# Patient Record
Sex: Female | Born: 1935 | Race: White | Hispanic: No | Marital: Married | Smoking: Former smoker
Health system: Southern US, Community
[De-identification: ages and names within clinical notes are randomized; demographics above are authoritative.]

## PROBLEM LIST (undated history)

## (undated) DIAGNOSIS — G43909 Migraine, unspecified, not intractable, without status migrainosus: Secondary | ICD-10-CM

## (undated) DIAGNOSIS — G4700 Insomnia, unspecified: Secondary | ICD-10-CM

## (undated) DIAGNOSIS — H43813 Vitreous degeneration, bilateral: Secondary | ICD-10-CM

## (undated) DIAGNOSIS — R1011 Right upper quadrant pain: Secondary | ICD-10-CM

## (undated) DIAGNOSIS — M8580 Other specified disorders of bone density and structure, unspecified site: Secondary | ICD-10-CM

## (undated) DIAGNOSIS — K5790 Diverticulosis of intestine, part unspecified, without perforation or abscess without bleeding: Secondary | ICD-10-CM

## (undated) DIAGNOSIS — M47812 Spondylosis without myelopathy or radiculopathy, cervical region: Secondary | ICD-10-CM

## (undated) DIAGNOSIS — E782 Mixed hyperlipidemia: Secondary | ICD-10-CM

## (undated) HISTORY — PX: EYE SURGERY: SHX253

## (undated) HISTORY — PX: BREAST CYST ASPIRATION: SHX578

---

## 1995-08-12 DIAGNOSIS — C569 Malignant neoplasm of unspecified ovary: Secondary | ICD-10-CM

## 1995-08-12 HISTORY — DX: Malignant neoplasm of unspecified ovary: C56.9

## 1995-08-12 HISTORY — PX: TOTAL ABDOMINAL HYSTERECTOMY W/ BILATERAL SALPINGOOPHORECTOMY: SHX83

## 2006-08-11 HISTORY — PX: COLONOSCOPY: SHX174

## 2016-08-22 HISTORY — PX: UPPER GASTROINTESTINAL ENDOSCOPY: SHX188

## 2016-08-22 HISTORY — PX: ESOPHAGOGASTRODUODENOSCOPY: SHX1529

## 2016-09-12 DIAGNOSIS — H2512 Age-related nuclear cataract, left eye: Secondary | ICD-10-CM

## 2016-09-12 HISTORY — DX: Age-related nuclear cataract, left eye: H25.12

## 2017-01-26 DIAGNOSIS — I471 Supraventricular tachycardia: Secondary | ICD-10-CM

## 2017-01-26 DIAGNOSIS — I4719 Other supraventricular tachycardia: Secondary | ICD-10-CM

## 2017-01-26 HISTORY — DX: Supraventricular tachycardia: I47.1

## 2017-01-26 HISTORY — DX: Other supraventricular tachycardia: I47.19

## 2017-03-10 DIAGNOSIS — R0789 Other chest pain: Secondary | ICD-10-CM

## 2017-03-10 HISTORY — DX: Other chest pain: R07.89

## 2018-04-28 DIAGNOSIS — F411 Generalized anxiety disorder: Secondary | ICD-10-CM

## 2018-04-28 HISTORY — DX: Generalized anxiety disorder: F41.1

## 2018-05-03 ENCOUNTER — Other Ambulatory Visit: Payer: Self-pay | Admitting: Nurse Practitioner

## 2018-05-03 DIAGNOSIS — R5381 Other malaise: Secondary | ICD-10-CM

## 2018-05-12 ENCOUNTER — Other Ambulatory Visit: Payer: Self-pay | Admitting: Nurse Practitioner

## 2018-05-12 DIAGNOSIS — M8580 Other specified disorders of bone density and structure, unspecified site: Secondary | ICD-10-CM

## 2018-07-14 ENCOUNTER — Ambulatory Visit
Admission: RE | Admit: 2018-07-14 | Discharge: 2018-07-14 | Disposition: A | Discharge status: Home / Self Care | Payer: Medicare HMO | Source: Ambulatory Visit | Attending: Nurse Practitioner | Admitting: Nurse Practitioner

## 2018-07-14 DIAGNOSIS — M8580 Other specified disorders of bone density and structure, unspecified site: Secondary | ICD-10-CM

## 2019-06-21 ENCOUNTER — Other Ambulatory Visit: Payer: Self-pay | Admitting: Family Medicine

## 2019-06-21 DIAGNOSIS — Z1231 Encounter for screening mammogram for malignant neoplasm of breast: Secondary | ICD-10-CM

## 2020-03-26 ENCOUNTER — Ambulatory Visit (INDEPENDENT_AMBULATORY_CARE_PROVIDER_SITE_OTHER): Payer: Medicare HMO | Admitting: Otolaryngology

## 2020-03-26 ENCOUNTER — Other Ambulatory Visit: Payer: Self-pay

## 2020-03-26 VITALS — Temp 97.3°F

## 2020-03-26 DIAGNOSIS — H903 Sensorineural hearing loss, bilateral: Secondary | ICD-10-CM

## 2020-03-26 DIAGNOSIS — H6121 Impacted cerumen, right ear: Secondary | ICD-10-CM

## 2020-03-26 DIAGNOSIS — H6982 Other specified disorders of Eustachian tube, left ear: Secondary | ICD-10-CM | POA: Diagnosis not present

## 2020-03-26 NOTE — Progress Notes (Signed)
HPI: Gloria Goodman is a 86 y.o. female who presents for evaluation of of complaints of ear symptoms as well as nasal symptoms of excessive mucus gathering in the throat.  She feels like the left ear stopped up at times and she hears a hollow sound in the left ear and occasionally hears a flutter in the left ear.  She had problems like this previously several years ago and treated  eustachian tube dysfunction with Flonase and saline rinses that seemed to help but not totally clear the symptoms.  She had a hearing test performed at that time but this was several years ago.  She has noticed some decline in her hearing.  No past medical history on file.  Social History   Socioeconomic History  . Marital status: Married    Spouse name: Not on file  . Number of children: Not on file  . Years of education: Not on file  . Highest education level: Not on file  Occupational History  . Not on file  Tobacco Use  . Smoking status: Not on file  Substance and Sexual Activity  . Alcohol use: Not on file  . Drug use: Not on file  . Sexual activity: Not on file  Other Topics Concern  . Not on file  Social History Narrative  . Not on file   Social Determinants of Health   Financial Resource Strain:   . Difficulty of Paying Living Expenses:   Food Insecurity:   . Worried About Charity fundraiser in the Last Year:   . Arboriculturist in the Last Year:   Transportation Needs:   . Film/video editor (Medical):   Marland Kitchen Lack of Transportation (Non-Medical):   Physical Activity:   . Days of Exercise per Week:   . Minutes of Exercise per Session:   Stress:   . Feeling of Stress :   Social Connections:   . Frequency of Communication with Friends and Family:   . Frequency of Social Gatherings with Friends and Family:   . Attends Religious Services:   . Active Member of Clubs or Organizations:   . Attends Archivist Meetings:   Marland Kitchen Marital Status:    No family history on  file. Allergies  Allergen Reactions  . Statins     Pt felt bad all over.   Prior to Admission medications   Not on File     Positive ROS: Otherwise negative  All other systems have been reviewed and were otherwise negative with the exception of those mentioned in the HPI and as above.  Physical Exam: Constitutional: Alert, well-appearing, no acute distress Ears: External ears without lesions or tenderness.  She has wax buildup in the right ear canal that was removed with forceps.  The right TM was clear with good mobility pneumatic otoscopy.  The left ear canal had minimal cerumen buildup that was nonobstructing and removed with a curette.  The left TM was clear with good mobility pneumatic otoscopy.  Hearing screening with the 1024 tuning fork revealed bilateral mild to moderate SNHL which is worse on the left side.  AC > BC bilaterally. Nasal: External nose without lesions. Septum with minimal deformity and mild rhinitis.. Clear nasal passages bilaterally.  Nasal pharynx could be visualized.  Both middle meatus regions were clear with no signs of infection. Oral: Lips and gums without lesions. Tongue and palate mucosa without lesions. Posterior oropharynx clear.  No excessive mucus noted in the posterior oropharynx. Neck: No  palpable adenopathy or masses Respiratory: Breathing comfortably  Skin: No facial/neck lesions or rash noted.  Cerumen impaction removal  Date/Time: 03/26/2020 2:50 PM Performed by: Rozetta Nunnery, MD Authorized by: Rozetta Nunnery, MD   Consent:    Consent obtained:  Verbal   Consent given by:  Patient   Risks discussed:  Pain and bleeding Procedure details:    Location:  R ear   Procedure type: forceps   Post-procedure details:    Inspection:  TM intact and canal normal   Hearing quality:  Improved   Patient tolerance of procedure:  Tolerated well, no immediate complications Comments:     TMs are clear  bilaterally.    Assessment: Mild rhinitis with questionable eustachian tube dysfunction. Bilateral SNHL.  Plan: Recommended use of Flonase and renewed her prescription 2 sprays each nostril at night as well as saline irrigation. She will follow-up in 2 to 3 weeks for recheck and audiologic testing.  Radene Journey, MD

## 2020-04-13 ENCOUNTER — Other Ambulatory Visit: Payer: Self-pay | Admitting: Nurse Practitioner

## 2020-04-13 DIAGNOSIS — Z1231 Encounter for screening mammogram for malignant neoplasm of breast: Secondary | ICD-10-CM

## 2020-04-18 ENCOUNTER — Ambulatory Visit (INDEPENDENT_AMBULATORY_CARE_PROVIDER_SITE_OTHER): Payer: Medicare HMO | Admitting: Otolaryngology

## 2020-04-26 ENCOUNTER — Ambulatory Visit
Admission: RE | Admit: 2020-04-26 | Discharge: 2020-04-26 | Disposition: A | Discharge status: Home / Self Care | Payer: Medicare HMO | Source: Ambulatory Visit | Attending: Nurse Practitioner | Admitting: Nurse Practitioner

## 2020-04-26 ENCOUNTER — Other Ambulatory Visit: Payer: Self-pay

## 2020-04-26 DIAGNOSIS — Z1231 Encounter for screening mammogram for malignant neoplasm of breast: Secondary | ICD-10-CM

## 2020-08-11 HISTORY — PX: CATARACT EXTRACTION: SUR2

## 2020-11-27 ENCOUNTER — Ambulatory Visit: Payer: Medicare HMO | Admitting: Physician Assistant

## 2021-01-18 ENCOUNTER — Other Ambulatory Visit: Payer: Self-pay | Admitting: Neurosurgery

## 2021-01-18 DIAGNOSIS — G959 Disease of spinal cord, unspecified: Secondary | ICD-10-CM

## 2021-01-28 ENCOUNTER — Ambulatory Visit
Admission: RE | Admit: 2021-01-28 | Discharge: 2021-01-28 | Disposition: A | Discharge status: Home / Self Care | Payer: Medicare HMO | Source: Ambulatory Visit | Attending: Neurosurgery | Admitting: Neurosurgery

## 2021-01-28 ENCOUNTER — Other Ambulatory Visit: Payer: Self-pay

## 2021-01-28 DIAGNOSIS — G959 Disease of spinal cord, unspecified: Secondary | ICD-10-CM

## 2021-02-05 ENCOUNTER — Other Ambulatory Visit: Payer: Self-pay | Admitting: Student

## 2021-02-05 DIAGNOSIS — D497 Neoplasm of unspecified behavior of endocrine glands and other parts of nervous system: Secondary | ICD-10-CM

## 2021-02-06 ENCOUNTER — Ambulatory Visit
Admission: RE | Admit: 2021-02-06 | Discharge: 2021-02-06 | Disposition: A | Discharge status: Home / Self Care | Payer: Medicare HMO | Source: Ambulatory Visit | Attending: Student | Admitting: Student

## 2021-02-06 DIAGNOSIS — D497 Neoplasm of unspecified behavior of endocrine glands and other parts of nervous system: Secondary | ICD-10-CM

## 2021-02-06 MED ORDER — GADOBENATE DIMEGLUMINE 529 MG/ML IV SOLN
11.0000 mL | Freq: Once | INTRAVENOUS | Status: AC | PRN
  Administered 2021-02-06: 11 mL via INTRAVENOUS

## 2021-02-20 ENCOUNTER — Other Ambulatory Visit: Payer: Self-pay | Admitting: Neurosurgery

## 2021-03-04 NOTE — Progress Notes (Signed)
Surgical Instructions    Your procedure is scheduled on Friday, July 29th, 2022 .  Report to [facility] Main Entrance "A" at 10:20 A.M., then check in with the Admitting office.  Call this number if you have problems the morning of surgery:  [phone]   If you have any questions prior to your surgery date call [phone]: Open Monday-Friday 8am-4pm    Remember:  Do not eat or drink after midnight the night before your surgery   Take these medicines the morning of surgery with A SIP OF WATER:  ALPRAZolam (XANAX)  acetaminophen (TYLENOL) - if needed Polyethyl Glycol-Propyl Glycol (SYSTANE OP) - if needed sodium chloride (OCEAN) - if needed  Follow your surgeon's instructions on when to stop Aspirin.  If no instructions were given by your surgeon then you will need to call the office to get those instructions.     As of today, STOP taking any Aspirin (unless otherwise instructed by your surgeon) Aleve, Naproxen, Ibuprofen, Motrin, Advil, Goody's, BC's, all herbal medications, fish oil, and all vitamins.          Do not wear jewelry or makeup Do not wear lotions, powders, perfumes, or deodorant. Do not shave 48 hours prior to surgery.   Do not bring valuables to the hospital. DO Not wear nail polish, gel polish, artificial nails, or any other type of covering on natural nails including finger and toenails. If patients have artificial nails, gel coating, etc. that need to be removed by a nail salon please have this removed prior to surgery or surgery may need to be canceled/delayed if the surgeon/ anesthesia feels like the patient is unable to be adequately monitored.             [facility] is not responsible for any belongings or valuables.  Do NOT Smoke (Tobacco/Vaping) or drink Alcohol 24 hours prior to your procedure If you use a CPAP at night, you may bring all equipment for your overnight stay.   Contacts, glasses, dentures or bridgework may not be worn into surgery,  please bring cases for these belongings   For patients admitted to the hospital, discharge time will be determined by your treatment team.   Patients discharged the day of surgery will not be allowed to drive home, and someone needs to stay with them for 24 hours.  ONLY 1 SUPPORT PERSON MAY BE PRESENT WHILE YOU ARE IN SURGERY. IF YOU ARE TO BE ADMITTED ONCE YOU ARE IN YOUR ROOM YOU WILL BE ALLOWED TWO (2) VISITORS.  Minor children may have two parents present. Special consideration for safety and communication needs will be reviewed on a case by case basis.  Special instructions:    Oral Hygiene is also important to reduce your risk of infection.  Remember - BRUSH YOUR TEETH THE MORNING OF SURGERY WITH YOUR REGULAR TOOTHPASTE   [place]- Preparing For Surgery  Before surgery, you can play an important role. Because skin is not sterile, your skin needs to be as free of germs as possible. You can reduce the number of germs on your skin by washing with CHG (chlorahexidine gluconate) Soap before surgery.  CHG is an antiseptic cleaner which kills germs and bonds with the skin to continue killing germs even after washing.     Please do not use if you have an allergy to CHG or antibacterial soaps. If your skin becomes reddened/irritated stop using the CHG.  Do not shave (including legs and underarms) for at least 48  hours prior to first CHG shower. It is OK to shave your face.  Please follow these instructions carefully.     Shower the NIGHT BEFORE SURGERY and the MORNING OF SURGERY with CHG Soap.   If you chose to wash your hair, wash your hair first as usual with your normal shampoo. After you shampoo, rinse your hair and body thoroughly to remove the shampoo.  Then [employer] and genitals (private parts) with your normal soap and rinse thoroughly to remove soap.  After that Use CHG Soap as you would any other liquid soap. You can apply CHG directly to the skin and wash gently with a scrungie  or a clean washcloth.   Apply the CHG Soap to your body ONLY FROM THE NECK DOWN.  Do not use on open wounds or open sores. Avoid contact with your eyes, ears, mouth and genitals (private parts). Wash Face and genitals (private parts)  with your normal soap.   Wash thoroughly, paying special attention to the area where your surgery will be performed.  Thoroughly rinse your body with warm water from the neck down.  DO NOT shower/wash with your normal soap after using and rinsing off the CHG Soap.  Pat yourself dry with a CLEAN TOWEL.  Wear CLEAN PAJAMAS to bed the night before surgery  Place CLEAN SHEETS on your bed the night before your surgery  DO NOT SLEEP WITH PETS.   Day of Surgery:  Take a shower with CHG soap. Wear Clean/Comfortable clothing the morning of surgery Do not apply any deodorants/lotions.   Remember to brush your teeth WITH YOUR REGULAR TOOTHPASTE.   Please read over the following fact sheets that you were given.

## 2021-03-05 ENCOUNTER — Encounter (HOSPITAL_COMMUNITY): Payer: Self-pay

## 2021-03-05 ENCOUNTER — Other Ambulatory Visit: Payer: Self-pay

## 2021-03-05 ENCOUNTER — Encounter (HOSPITAL_COMMUNITY)
Admission: RE | Admit: 2021-03-05 | Discharge: 2021-03-05 | Disposition: A | Discharge status: Home / Self Care | Payer: Medicare HMO | Source: Ambulatory Visit | Attending: Neurosurgery | Admitting: Neurosurgery

## 2021-03-05 DIAGNOSIS — Z01812 Encounter for preprocedural laboratory examination: Secondary | ICD-10-CM | POA: Insufficient documentation

## 2021-03-05 DIAGNOSIS — Z20822 Contact with and (suspected) exposure to covid-19: Secondary | ICD-10-CM | POA: Insufficient documentation

## 2021-03-05 HISTORY — DX: Migraine, unspecified, not intractable, without status migrainosus: G43.909

## 2021-03-05 HISTORY — DX: Mixed hyperlipidemia: E78.2

## 2021-03-05 HISTORY — DX: Vitreous degeneration, bilateral: H43.813

## 2021-03-05 HISTORY — DX: Insomnia, unspecified: G47.00

## 2021-03-05 HISTORY — DX: Diverticulosis of intestine, part unspecified, without perforation or abscess without bleeding: K57.90

## 2021-03-05 HISTORY — DX: Spondylosis without myelopathy or radiculopathy, cervical region: M47.812

## 2021-03-05 HISTORY — DX: Other specified disorders of bone density and structure, unspecified site: M85.80

## 2021-03-05 HISTORY — DX: Right upper quadrant pain: R10.11

## 2021-03-05 LAB — CBC WITH DIFFERENTIAL/PLATELET
Abs Immature Granulocytes: 0.02 10*3/uL (ref 0.00–0.07)
Basophils Absolute: 0 10*3/uL (ref 0.0–0.1)
Basophils Relative: 0 %
Eosinophils Absolute: 0.3 10*3/uL (ref 0.0–0.5)
Eosinophils Relative: 5 %
HCT: 43.2 % (ref 36.0–46.0)
Hemoglobin: 14.2 g/dL (ref 12.0–15.0)
Immature Granulocytes: 0 %
Lymphocytes Relative: 21 %
Lymphs Abs: 1.2 10*3/uL (ref 0.7–4.0)
MCH: 28.9 pg (ref 26.0–34.0)
MCHC: 32.9 g/dL (ref 30.0–36.0)
MCV: 88 fL (ref 80.0–100.0)
Monocytes Absolute: 0.6 10*3/uL (ref 0.1–1.0)
Monocytes Relative: 11 %
Neutro Abs: 3.4 10*3/uL (ref 1.7–7.7)
Neutrophils Relative %: 63 %
Platelets: 188 10*3/uL (ref 150–400)
RBC: 4.91 MIL/uL (ref 3.87–5.11)
RDW: 13.6 % (ref 11.5–15.5)
WBC: 5.5 10*3/uL (ref 4.0–10.5)
nRBC: 0 % (ref 0.0–0.2)

## 2021-03-05 LAB — BASIC METABOLIC PANEL
Anion gap: 7 (ref 5–15)
BUN: 11 mg/dL (ref 8–23)
CO2: 28 mmol/L (ref 22–32)
Calcium: 9.2 mg/dL (ref 8.9–10.3)
Chloride: 106 mmol/L (ref 98–111)
Creatinine, Ser: 0.92 mg/dL (ref 0.44–1.00)
GFR, Estimated: >60 mL/min (ref >60)
Glucose, Bld: 115 mg/dL — ABNORMAL HIGH (ref 70–99)
Potassium: 4.2 mmol/L (ref 3.5–5.1)
Sodium: 141 mmol/L (ref 135–145)

## 2021-03-05 LAB — SURGICAL PCR SCREEN
MRSA, PCR: NEGATIVE
Staphylococcus aureus: NEGATIVE

## 2021-03-05 LAB — SARS CORONAVIRUS 2 (TAT 6-24 HRS): SARS Coronavirus 2: NEGATIVE

## 2021-03-05 NOTE — Progress Notes (Signed)
PCP - Dr. Fonnie Jarvis Cardiologist - Dr. Roslynn Amble Had some chest pain testing done in 2018 no f/u needed  Chest x-ray - Not indicated EKG - Not indicated Stress Test - 01/08/17 - normal ECHO - Denies Cardiac Cath - 03/11/17 - clear   Sleep Study - Denies  DM - Denies  Aspirin Instructions: On hold for surgery per Dr. Marchelle Folks office  COVID TEST- 03/05/21   Anesthesia review: Yes history of Paroxysmal Atrial Tachycardia  Patient denies shortness of breath, fever, cough and chest pain at PAT appointment  States she does have occasional chest discomfort after walking fast.   All instructions explained to the patient, with a verbal understanding of the material. Patient agrees to go over the instructions while at home for a better understanding. Patient also instructed to wear a mask in public after being tested for COVID-19. The opportunity to ask questions was provided.

## 2021-03-06 NOTE — Progress Notes (Signed)
Anesthesia Chart Review:  Patient was evaluated by cardiology at [facility] in 2018 for report of chesttightness.  Treadmill stress test was attempted however she developed PAT with ventricular rate exceeding 2000 informs of chest tightness.  There were equivocal ST changes. Patient was started on diltiazem ER and provided with a heart monitor. She underwent Lexiscan Cardiolite testing which demonstrated a normal pattern of perfusion with no scintigraphic evidence for inducible myocardial ischemia. Calculated LVEF was 86%. Her 30-day cardiac monitoring more often than not demonstrated that "fluttering in the chest" symptoms occurred during normal rhythm with rate 60-70 bpm. Over the course of the month-long monitoring, she had a 3 beat run (atrial triplet) and a 4 beat run of atrial tachycardia. Despite nonischemic nucellar stress, pt continued to report some symptoms of chest tightness with exertion. She ultimately underwent LHC showing nonobstructive CAD, 20-30% LAD and RCA narrowing. Short runs of PACs noted. Hyperdynamic LV systolic function with EF 70-75%.  Preop labs reviewed, unremarkable.   MRI Thoracic spine 02/06/21: IMPRESSION: Intradural, extramedullary mass along the left aspect of the thecal sac centered at the T3 level most consistent with a meningioma. Likely chronic rightward displacement and compression of the cord without definite abnormal signal.  Cath 03/11/2017 (care everywhere): CONCLUSIONS:  1. Nonobstructive CAD, 20-30% LAD and RCA narrowing. 2. Hyperdynamic LV systolic function with EF 70-75%. 3. Short runs of PACs noted. 4. Right radial artery access. 5. No complications.    Wynonia Musty [facility] Stay [facility]/Anesthesiology Phone [phone] 03/06/2021 10:54 AM

## 2021-03-06 NOTE — Anesthesia Preprocedure Evaluation (Addendum)
Anesthesia Evaluation  Patient identified by MRN, date of birth, ID band Patient awake    Reviewed: Allergy & Precautions, NPO status , Patient's Chart, lab work & pertinent test results  History of Anesthesia Complications Negative for: history of anesthetic complications  Airway Mallampati: II  TM Distance: >3 FB Neck ROM: Full    Dental  (+) Dental Advisory Given   Pulmonary neg pulmonary ROS, former smoker,    Pulmonary exam normal        Cardiovascular Normal cardiovascular exam+ dysrhythmias Supra Ventricular Tachycardia    PAT note by Karoline Caldwell, PA-C: Patient was evaluated by cardiology at [facility] in 2018 for report of chesttightness.  Treadmill stress test was attempted however she developed PAT with ventricular rate exceeding 2000 informs of chest tightness.  There were equivocal ST changes. Patient was started on diltiazem ER and provided with a heart monitor. She underwent Lexiscan Cardiolite testing which demonstrated a normal pattern of perfusion with no scintigraphic evidence for inducible myocardial ischemia. Calculated LVEF was 86%. Her 30-day cardiac monitoring more often than not demonstrated that "fluttering in the chest" symptoms occurred during normal rhythm with rate 60-70 bpm. Over the course of the month-long monitoring, she had a 3 beat run (atrial triplet) and a 4 beat run of atrial tachycardia. Despite nonischemic nucellar stress, pt continued to report some symptoms of chest tightness with exertion. She ultimately underwent LHC showing nonobstructive CAD, 20-30% LAD and RCA narrowing. Short runs of PACs noted. Hyperdynamic LV systolic function with EF 70-75%.    Neuro/Psych  Headaches, PSYCHIATRIC DISORDERS Anxiety    GI/Hepatic negative GI ROS, Neg liver ROS,   Endo/Other  negative endocrine ROS  Renal/GU negative Renal ROS     Musculoskeletal  (+) Arthritis ,   Abdominal   Peds   Hematology negative hematology ROS (+)   Anesthesia Other Findings Covid test negative   Reproductive/Obstetrics                           Anesthesia Physical Anesthesia Plan  ASA: 2  Anesthesia Plan: General   Post-op Pain Management:    Induction: Intravenous  PONV Risk Score and Plan: 3 and Treatment may vary due to age or medical condition, Ondansetron and Aprepitant  Airway Management Planned: Oral ETT  Additional Equipment: None  Intra-op Plan:   Post-operative Plan: Extubation in OR  Informed Consent: I have reviewed the patients History and Physical, chart, labs and discussed the procedure including the risks, benefits and alternatives for the proposed anesthesia with the patient or authorized representative who has indicated his/her understanding and acceptance.     Dental advisory given  Plan Discussed with: CRNA and Anesthesiologist  Anesthesia Plan Comments: ( )      Anesthesia Quick Evaluation

## 2021-03-07 NOTE — Progress Notes (Signed)
Pt informed of the surgery time change of 0800 and to arrive at 0600.

## 2021-03-08 ENCOUNTER — Inpatient Hospital Stay (HOSPITAL_COMMUNITY): Payer: Medicare HMO

## 2021-03-08 ENCOUNTER — Inpatient Hospital Stay (HOSPITAL_COMMUNITY): Payer: Medicare HMO | Admitting: Physician Assistant

## 2021-03-08 ENCOUNTER — Inpatient Hospital Stay (HOSPITAL_COMMUNITY): Payer: Medicare HMO | Admitting: Certified Registered"

## 2021-03-08 ENCOUNTER — Other Ambulatory Visit: Payer: Self-pay

## 2021-03-08 ENCOUNTER — Inpatient Hospital Stay (HOSPITAL_COMMUNITY)
Admission: RE | Admit: 2021-03-08 | Discharge: 2021-03-10 | DRG: 029 | Disposition: A | Discharge status: Home / Self Care | Payer: Medicare HMO | Attending: Neurosurgery | Admitting: Neurosurgery

## 2021-03-08 ENCOUNTER — Encounter (HOSPITAL_COMMUNITY): Payer: Self-pay | Admitting: Neurosurgery

## 2021-03-08 ENCOUNTER — Inpatient Hospital Stay (HOSPITAL_COMMUNITY)
Admission: RE | Disposition: A | Discharge status: Home / Self Care | Payer: Self-pay | Source: Home / Self Care | Attending: Neurosurgery

## 2021-03-08 DIAGNOSIS — Z90710 Acquired absence of both cervix and uterus: Secondary | ICD-10-CM

## 2021-03-08 DIAGNOSIS — M8580 Other specified disorders of bone density and structure, unspecified site: Secondary | ICD-10-CM | POA: Diagnosis present

## 2021-03-08 DIAGNOSIS — F411 Generalized anxiety disorder: Secondary | ICD-10-CM | POA: Diagnosis present

## 2021-03-08 DIAGNOSIS — Z79899 Other long term (current) drug therapy: Secondary | ICD-10-CM

## 2021-03-08 DIAGNOSIS — D321 Benign neoplasm of spinal meninges: Principal | ICD-10-CM | POA: Diagnosis present

## 2021-03-08 DIAGNOSIS — Z7982 Long term (current) use of aspirin: Secondary | ICD-10-CM

## 2021-03-08 DIAGNOSIS — M47812 Spondylosis without myelopathy or radiculopathy, cervical region: Secondary | ICD-10-CM | POA: Diagnosis present

## 2021-03-08 DIAGNOSIS — G9520 Unspecified cord compression: Secondary | ICD-10-CM | POA: Diagnosis present

## 2021-03-08 DIAGNOSIS — G4700 Insomnia, unspecified: Secondary | ICD-10-CM | POA: Diagnosis present

## 2021-03-08 DIAGNOSIS — E782 Mixed hyperlipidemia: Secondary | ICD-10-CM | POA: Diagnosis present

## 2021-03-08 DIAGNOSIS — Z87891 Personal history of nicotine dependence: Secondary | ICD-10-CM

## 2021-03-08 DIAGNOSIS — Z8543 Personal history of malignant neoplasm of ovary: Secondary | ICD-10-CM | POA: Diagnosis not present

## 2021-03-08 DIAGNOSIS — Z20822 Contact with and (suspected) exposure to covid-19: Secondary | ICD-10-CM | POA: Diagnosis present

## 2021-03-08 DIAGNOSIS — Z888 Allergy status to other drugs, medicaments and biological substances status: Secondary | ICD-10-CM | POA: Diagnosis not present

## 2021-03-08 DIAGNOSIS — Z419 Encounter for procedure for purposes other than remedying health state, unspecified: Principal | ICD-10-CM

## 2021-03-08 DIAGNOSIS — D497 Neoplasm of unspecified behavior of endocrine glands and other parts of nervous system: Secondary | ICD-10-CM | POA: Diagnosis present

## 2021-03-08 HISTORY — PX: LAMINECTOMY: SHX219

## 2021-03-08 SURGERY — THORACIC LAMINECTOMY FOR TUMOR
Anesthesia: General | Site: Spine Thoracic

## 2021-03-08 MED ORDER — CEFAZOLIN SODIUM-DEXTROSE 2-4 GM/100ML-% IV SOLN
2.0000 g | INTRAVENOUS | Status: AC
  Administered 2021-03-08: 2 g via INTRAVENOUS
  Filled 2021-03-08: qty 100

## 2021-03-08 MED ORDER — ONDANSETRON HCL 4 MG/2ML IJ SOLN
INTRAMUSCULAR | Status: AC
  Filled 2021-03-08: qty 2

## 2021-03-08 MED ORDER — CEFAZOLIN SODIUM-DEXTROSE 1-4 GM/50ML-% IV SOLN
1.0000 g | Freq: Three times a day (TID) | INTRAVENOUS | Status: AC
  Administered 2021-03-08 – 2021-03-09 (×2): 1 g via INTRAVENOUS
  Filled 2021-03-08 (×2): qty 50

## 2021-03-08 MED ORDER — SUFENTANIL CITRATE 50 MCG/ML IV SOLN
INTRAVENOUS | Status: DC | PRN
  Administered 2021-03-08: 15 ug via INTRAVENOUS
  Administered 2021-03-08: 5 ug via INTRAVENOUS

## 2021-03-08 MED ORDER — FENTANYL CITRATE (PF) 100 MCG/2ML IJ SOLN
25.0000 ug | INTRAMUSCULAR | Status: DC | PRN
  Administered 2021-03-08 (×4): 25 ug via INTRAVENOUS

## 2021-03-08 MED ORDER — SODIUM CHLORIDE 0.9% FLUSH: 3.0000 mL | INTRAVENOUS | Status: DC | PRN

## 2021-03-08 MED ORDER — SALINE SPRAY 0.65 % NA SOLN
1.0000 | NASAL | Status: DC | PRN
  Filled 2021-03-08: qty 44

## 2021-03-08 MED ORDER — SUFENTANIL CITRATE 50 MCG/ML IV SOLN
INTRAVENOUS | Status: AC
  Filled 2021-03-08: qty 1

## 2021-03-08 MED ORDER — ROCURONIUM BROMIDE 10 MG/ML (PF) SYRINGE
PREFILLED_SYRINGE | INTRAVENOUS | Status: AC
  Filled 2021-03-08: qty 10

## 2021-03-08 MED ORDER — HEMOSTATIC AGENTS (NO CHARGE) OPTIME
TOPICAL | Status: DC | PRN
  Administered 2021-03-08: 1 via TOPICAL

## 2021-03-08 MED ORDER — OXYCODONE HCL 5 MG PO TABS
ORAL_TABLET | ORAL | Status: AC
  Filled 2021-03-08: qty 1

## 2021-03-08 MED ORDER — CHLORHEXIDINE GLUCONATE CLOTH 2 % EX PADS: 6.0000 | MEDICATED_PAD | Freq: Once | CUTANEOUS | Status: DC

## 2021-03-08 MED ORDER — BUPIVACAINE HCL (PF) 0.25 % IJ SOLN
INTRAMUSCULAR | Status: DC | PRN
  Administered 2021-03-08: 20 mL

## 2021-03-08 MED ORDER — BACITRACIN ZINC 500 UNIT/GM EX OINT
TOPICAL_OINTMENT | CUTANEOUS | Status: AC
  Filled 2021-03-08: qty 28.35

## 2021-03-08 MED ORDER — CHLORHEXIDINE GLUCONATE 0.12 % MT SOLN
15.0000 mL | Freq: Once | OROMUCOSAL | Status: AC
  Administered 2021-03-08: 15 mL via OROMUCOSAL
  Filled 2021-03-08: qty 15

## 2021-03-08 MED ORDER — FENTANYL CITRATE (PF) 100 MCG/2ML IJ SOLN
INTRAMUSCULAR | Status: AC
  Administered 2021-03-08: 25 ug via INTRAVENOUS
  Filled 2021-03-08: qty 2

## 2021-03-08 MED ORDER — CYCLOBENZAPRINE HCL 10 MG PO TABS
10.0000 mg | ORAL_TABLET | Freq: Three times a day (TID) | ORAL | Status: DC | PRN
  Administered 2021-03-08 – 2021-03-09 (×2): 10 mg via ORAL
  Filled 2021-03-08 (×2): qty 1

## 2021-03-08 MED ORDER — ALPRAZOLAM 0.25 MG PO TABS
0.2500 mg | ORAL_TABLET | ORAL | Status: DC
  Administered 2021-03-09 – 2021-03-10 (×3): 0.25 mg via ORAL
  Filled 2021-03-08 (×3): qty 1

## 2021-03-08 MED ORDER — POLYVINYL ALCOHOL 1.4 % OP SOLN
1.0000 [drp] | Freq: Every day | OPHTHALMIC | Status: DC | PRN
  Filled 2021-03-08: qty 15

## 2021-03-08 MED ORDER — HYDROCODONE-ACETAMINOPHEN 10-325 MG PO TABS: 2.0000 | ORAL_TABLET | ORAL | Status: DC | PRN

## 2021-03-08 MED ORDER — MENTHOL 3 MG MT LOZG: 1.0000 | LOZENGE | OROMUCOSAL | Status: DC | PRN

## 2021-03-08 MED ORDER — THROMBIN 20000 UNITS EX SOLR
CUTANEOUS | Status: DC | PRN
  Administered 2021-03-08: 20 mL via TOPICAL

## 2021-03-08 MED ORDER — ONDANSETRON HCL 4 MG/2ML IJ SOLN
4.0000 mg | Freq: Four times a day (QID) | INTRAMUSCULAR | Status: DC | PRN
  Administered 2021-03-08: 4 mg via INTRAVENOUS
  Filled 2021-03-08: qty 2

## 2021-03-08 MED ORDER — ACETAMINOPHEN 325 MG PO TABS
650.0000 mg | ORAL_TABLET | ORAL | Status: DC | PRN
  Administered 2021-03-09: 650 mg via ORAL
  Filled 2021-03-08: qty 2

## 2021-03-08 MED ORDER — VITAMIN D 25 MCG (1000 UNIT) PO TABS: 1000.0000 [IU] | ORAL_TABLET | ORAL | Status: DC

## 2021-03-08 MED ORDER — ALPRAZOLAM 0.5 MG PO TABS
0.5000 mg | ORAL_TABLET | Freq: Every day | ORAL | Status: DC
  Administered 2021-03-08 – 2021-03-09 (×2): 0.5 mg via ORAL
  Filled 2021-03-08 (×2): qty 1

## 2021-03-08 MED ORDER — ORAL CARE MOUTH RINSE: 15.0000 mL | Freq: Once | OROMUCOSAL | Status: AC

## 2021-03-08 MED ORDER — PHENOL 1.4 % MT LIQD: 1.0000 | OROMUCOSAL | Status: DC | PRN

## 2021-03-08 MED ORDER — ONDANSETRON HCL 4 MG/2ML IJ SOLN
INTRAMUSCULAR | Status: DC | PRN
  Administered 2021-03-08: 4 mg via INTRAVENOUS

## 2021-03-08 MED ORDER — SUGAMMADEX SODIUM 200 MG/2ML IV SOLN
INTRAVENOUS | Status: DC | PRN
  Administered 2021-03-08: 200 mg via INTRAVENOUS

## 2021-03-08 MED ORDER — THROMBIN 20000 UNITS EX SOLR
CUTANEOUS | Status: AC
  Filled 2021-03-08: qty 20000

## 2021-03-08 MED ORDER — PROPOFOL 10 MG/ML IV BOLUS
INTRAVENOUS | Status: AC
  Filled 2021-03-08: qty 20

## 2021-03-08 MED ORDER — ACETAMINOPHEN 650 MG RE SUPP: 650.0000 mg | RECTAL | Status: DC | PRN

## 2021-03-08 MED ORDER — THROMBIN 5000 UNITS EX SOLR
CUTANEOUS | Status: AC
  Filled 2021-03-08: qty 5000

## 2021-03-08 MED ORDER — HYDROCODONE-ACETAMINOPHEN 5-325 MG PO TABS
1.0000 | ORAL_TABLET | ORAL | Status: DC | PRN
Start: 2021-03-08 — End: 2021-03-10
  Administered 2021-03-08: 1 via ORAL
  Filled 2021-03-08: qty 1

## 2021-03-08 MED ORDER — BACITRACIN ZINC 500 UNIT/GM EX OINT
TOPICAL_OINTMENT | CUTANEOUS | Status: DC | PRN
  Administered 2021-03-08: 1 via TOPICAL

## 2021-03-08 MED ORDER — MIDAZOLAM HCL 2 MG/2ML IJ SOLN
INTRAMUSCULAR | Status: AC
  Filled 2021-03-08: qty 2

## 2021-03-08 MED ORDER — THROMBIN 5000 UNITS EX SOLR
OROMUCOSAL | Status: DC | PRN
  Administered 2021-03-08 (×2): 5 mL via TOPICAL

## 2021-03-08 MED ORDER — OXYCODONE HCL 5 MG PO TABS
5.0000 mg | ORAL_TABLET | Freq: Once | ORAL | Status: AC | PRN
  Administered 2021-03-08: 5 mg via ORAL

## 2021-03-08 MED ORDER — OXYCODONE HCL 5 MG/5ML PO SOLN: 5.0000 mg | Freq: Once | ORAL | Status: AC | PRN

## 2021-03-08 MED ORDER — HYDROMORPHONE HCL 1 MG/ML IJ SOLN
1.0000 mg | INTRAMUSCULAR | Status: DC | PRN
Start: 2021-03-08 — End: 2021-03-10

## 2021-03-08 MED ORDER — 0.9 % SODIUM CHLORIDE (POUR BTL) OPTIME
TOPICAL | Status: DC | PRN
  Administered 2021-03-08: 1000 mL

## 2021-03-08 MED ORDER — BUPIVACAINE HCL (PF) 0.25 % IJ SOLN
INTRAMUSCULAR | Status: AC
  Filled 2021-03-08: qty 30

## 2021-03-08 MED ORDER — DEXAMETHASONE SODIUM PHOSPHATE 10 MG/ML IJ SOLN
10.0000 mg | Freq: Once | INTRAMUSCULAR | Status: AC
  Administered 2021-03-08: 10 mg via INTRAVENOUS
  Filled 2021-03-08: qty 1

## 2021-03-08 MED ORDER — LIDOCAINE 2% (20 MG/ML) 5 ML SYRINGE
INTRAMUSCULAR | Status: AC
  Filled 2021-03-08: qty 5

## 2021-03-08 MED ORDER — CALCIUM CARBONATE ANTACID 500 MG PO CHEW: 500.0000 mg | CHEWABLE_TABLET | Freq: Every day | ORAL | Status: DC | PRN

## 2021-03-08 MED ORDER — LACTATED RINGERS IV SOLN: INTRAVENOUS | Status: DC

## 2021-03-08 MED ORDER — VANCOMYCIN HCL 1000 MG IV SOLR
INTRAVENOUS | Status: AC
  Filled 2021-03-08: qty 1000

## 2021-03-08 MED ORDER — ROCURONIUM BROMIDE 10 MG/ML (PF) SYRINGE
PREFILLED_SYRINGE | INTRAVENOUS | Status: DC | PRN
  Administered 2021-03-08 (×2): 10 mg via INTRAVENOUS
  Administered 2021-03-08: 60 mg via INTRAVENOUS

## 2021-03-08 MED ORDER — FENTANYL CITRATE (PF) 100 MCG/2ML IJ SOLN
INTRAMUSCULAR | Status: AC
  Filled 2021-03-08: qty 2

## 2021-03-08 MED ORDER — PROPOFOL 10 MG/ML IV BOLUS
INTRAVENOUS | Status: DC | PRN
  Administered 2021-03-08: 60 mg via INTRAVENOUS
  Administered 2021-03-08 (×2): 30 mg via INTRAVENOUS

## 2021-03-08 MED ORDER — SODIUM CHLORIDE 0.9 % IV SOLN
250.0000 mL | INTRAVENOUS | Status: DC
  Administered 2021-03-08: 250 mL via INTRAVENOUS

## 2021-03-08 MED ORDER — KETOROLAC TROMETHAMINE 15 MG/ML IJ SOLN
INTRAMUSCULAR | Status: AC
  Filled 2021-03-08: qty 1

## 2021-03-08 MED ORDER — PHENYLEPHRINE 40 MCG/ML (10ML) SYRINGE FOR IV PUSH (FOR BLOOD PRESSURE SUPPORT)
PREFILLED_SYRINGE | INTRAVENOUS | Status: AC
  Filled 2021-03-08: qty 10

## 2021-03-08 MED ORDER — SODIUM CHLORIDE 0.9% FLUSH
3.0000 mL | Freq: Two times a day (BID) | INTRAVENOUS | Status: DC
  Administered 2021-03-08 – 2021-03-09 (×2): 3 mL via INTRAVENOUS

## 2021-03-08 MED ORDER — MIDAZOLAM HCL 2 MG/2ML IJ SOLN
INTRAMUSCULAR | Status: DC | PRN
  Administered 2021-03-08: 2 mg via INTRAVENOUS

## 2021-03-08 MED ORDER — APREPITANT 40 MG PO CAPS
40.0000 mg | ORAL_CAPSULE | Freq: Once | ORAL | Status: AC
  Administered 2021-03-08: 40 mg via ORAL
  Filled 2021-03-08: qty 1

## 2021-03-08 MED ORDER — PHENYLEPHRINE 40 MCG/ML (10ML) SYRINGE FOR IV PUSH (FOR BLOOD PRESSURE SUPPORT)
PREFILLED_SYRINGE | INTRAVENOUS | Status: DC | PRN
  Administered 2021-03-08 (×2): 80 ug via INTRAVENOUS
  Administered 2021-03-08 (×2): 40 ug via INTRAVENOUS

## 2021-03-08 MED ORDER — SODIUM CHLORIDE (PF) 0.9 % IJ SOLN
INTRAMUSCULAR | Status: AC
  Filled 2021-03-08: qty 10

## 2021-03-08 MED ORDER — ONDANSETRON HCL 4 MG PO TABS: 4.0000 mg | ORAL_TABLET | Freq: Four times a day (QID) | ORAL | Status: DC | PRN

## 2021-03-08 MED ORDER — ASPIRIN EC 81 MG PO TBEC: 81.0000 mg | DELAYED_RELEASE_TABLET | ORAL | Status: DC

## 2021-03-08 MED ORDER — ONDANSETRON HCL 4 MG/2ML IJ SOLN: 4.0000 mg | Freq: Once | INTRAMUSCULAR | Status: AC | PRN

## 2021-03-08 MED ORDER — ONDANSETRON HCL 4 MG/2ML IJ SOLN
INTRAMUSCULAR | Status: AC
  Administered 2021-03-08: 4 mg via INTRAVENOUS
  Filled 2021-03-08: qty 2

## 2021-03-08 MED ORDER — KETOROLAC TROMETHAMINE 15 MG/ML IJ SOLN
15.0000 mg | Freq: Four times a day (QID) | INTRAMUSCULAR | Status: AC
  Administered 2021-03-08 – 2021-03-09 (×4): 15 mg via INTRAVENOUS
  Filled 2021-03-08 (×3): qty 1

## 2021-03-08 SURGICAL SUPPLY — 67 items
BAG COUNTER SPONGE SURGICOUNT (BAG) ×2 IMPLANT
BAG DECANTER FOR FLEXI CONT (MISCELLANEOUS) ×2 IMPLANT
BAND RUBBER #18 3X1/16 STRL (MISCELLANEOUS) ×4 IMPLANT
BENZOIN TINCTURE PRP APPL 2/3 (GAUZE/BANDAGES/DRESSINGS) ×2 IMPLANT
BLADE CLIPPER SURG (BLADE) ×2 IMPLANT
BLADE SURG 11 STRL SS (BLADE) IMPLANT
BUR CUTTER 7.0 ROUND (BURR) ×2 IMPLANT
BUR MATCHSTICK NEURO 3.0 LAGG (BURR) ×2 IMPLANT
CANISTER SUCT 3000ML PPV (MISCELLANEOUS) ×2 IMPLANT
CARTRIDGE OIL MAESTRO DRILL (MISCELLANEOUS) ×1 IMPLANT
CLSR STERI-STRIP ANTIMIC 1/2X4 (GAUZE/BANDAGES/DRESSINGS) ×2 IMPLANT
DERMABOND ADVANCED (GAUZE/BANDAGES/DRESSINGS) ×1
DERMABOND ADVANCED .7 DNX12 (GAUZE/BANDAGES/DRESSINGS) ×1 IMPLANT
DIFFUSER DRILL AIR PNEUMATIC (MISCELLANEOUS) ×2 IMPLANT
DRAPE C-ARM 42X72 X-RAY (DRAPES) ×4 IMPLANT
DRAPE LAPAROTOMY 100X72X124 (DRAPES) ×2 IMPLANT
DRAPE LAPAROTOMY T 102X78X121 (DRAPES) IMPLANT
DRAPE MICROSCOPE LEICA (MISCELLANEOUS) ×2 IMPLANT
DRAPE SURG 17X23 STRL (DRAPES) ×4 IMPLANT
DRSG OPSITE POSTOP 4X6 (GAUZE/BANDAGES/DRESSINGS) ×2 IMPLANT
ELECT REM PT RETURN 9FT ADLT (ELECTROSURGICAL) ×2
ELECTRODE REM PT RTRN 9FT ADLT (ELECTROSURGICAL) ×1 IMPLANT
GAUZE 4X4 16PLY ~~LOC~~+RFID DBL (SPONGE) ×2 IMPLANT
GAUZE SPONGE 4X4 12PLY STRL (GAUZE/BANDAGES/DRESSINGS) IMPLANT
GLOVE SURG ENC MOIS LTX SZ6.5 (GLOVE) ×6 IMPLANT
GLOVE SURG LTX SZ9 (GLOVE) ×2 IMPLANT
GLOVE SURG UNDER POLY LF SZ6.5 (GLOVE) ×2 IMPLANT
GLOVE SURG UNDER POLY LF SZ7 (GLOVE) ×6 IMPLANT
GOWN STRL REUS W/ TWL LRG LVL3 (GOWN DISPOSABLE) ×2 IMPLANT
GOWN STRL REUS W/ TWL XL LVL3 (GOWN DISPOSABLE) ×1 IMPLANT
GOWN STRL REUS W/TWL 2XL LVL3 (GOWN DISPOSABLE) IMPLANT
GOWN STRL REUS W/TWL LRG LVL3 (GOWN DISPOSABLE) ×2
GOWN STRL REUS W/TWL XL LVL3 (GOWN DISPOSABLE) ×1
GRAFT DURAGEN MATRIX 1WX1L (Tissue) ×2 IMPLANT
HEMOSTAT POWDER KIT SURGIFOAM (HEMOSTASIS) ×4 IMPLANT
HEMOSTAT SURGICEL 2X14 (HEMOSTASIS) IMPLANT
KIT BASIN OR (CUSTOM PROCEDURE TRAY) ×6 IMPLANT
KIT TURNOVER KIT B (KITS) ×2 IMPLANT
NEEDLE HYPO 22GX1.5 SAFETY (NEEDLE) ×2 IMPLANT
NEEDLE HYPO 25X1 1.5 SAFETY (NEEDLE) IMPLANT
NEEDLE SPNL 20GX3.5 QUINCKE YW (NEEDLE) ×2 IMPLANT
NS IRRIG 1000ML POUR BTL (IV SOLUTION) ×2 IMPLANT
OIL CARTRIDGE MAESTRO DRILL (MISCELLANEOUS) ×2
PACK LAMINECTOMY NEURO (CUSTOM PROCEDURE TRAY) ×2 IMPLANT
PATTIES SURGICAL .5 X.5 (GAUZE/BANDAGES/DRESSINGS) IMPLANT
PATTIES SURGICAL .5 X3 (DISPOSABLE) ×2 IMPLANT
SEALANT ADHERUS EXTEND TIP (MISCELLANEOUS) ×2 IMPLANT
SPONGE SURGIFOAM ABS GEL 100 (HEMOSTASIS) ×2 IMPLANT
SPONGE T-LAP 4X18 ~~LOC~~+RFID (SPONGE) ×4 IMPLANT
STAPLER VISISTAT 35W (STAPLE) ×2 IMPLANT
STRIP CLOSURE SKIN 1/2X4 (GAUZE/BANDAGES/DRESSINGS) ×2 IMPLANT
SUT BONE WAX W31G (SUTURE) IMPLANT
SUT ETHILON 4 0 PS 2 18 (SUTURE) ×2 IMPLANT
SUT NURALON 4 0 TR CR/8 (SUTURE) ×2 IMPLANT
SUT PROLENE 6 0 BV (SUTURE) ×2 IMPLANT
SUT VIC AB 0 CT1 18XCR BRD8 (SUTURE) ×1 IMPLANT
SUT VIC AB 0 CT1 8-18 (SUTURE) ×1
SUT VIC AB 2-0 CT1 18 (SUTURE) ×2 IMPLANT
SUT VIC AB 3-0 SH 8-18 (SUTURE) ×2 IMPLANT
SUT VICRYL 4-0 PS2 18IN ABS (SUTURE) IMPLANT
TIP NONSTICK .5MMX23CM (INSTRUMENTS) ×1
TIP NONSTICK .5X23 (INSTRUMENTS) ×1 IMPLANT
TOWEL GREEN STERILE (TOWEL DISPOSABLE) ×2 IMPLANT
TOWEL GREEN STERILE FF (TOWEL DISPOSABLE) ×2 IMPLANT
TRAY FOLEY MTR SLVR 16FR STAT (SET/KITS/TRAYS/PACK) IMPLANT
TUBE CONNECTING 12X1/4 (SUCTIONS) IMPLANT
WATER STERILE IRR 1000ML POUR (IV SOLUTION) ×2 IMPLANT

## 2021-03-08 NOTE — Brief Op Note (Signed)
03/08/2021  10:22 AM  PATIENT:  Gloria Goodman  87 y.o. female  PRE-OPERATIVE DIAGNOSIS:  Intradural extramedullary thoracic tumor  POST-OPERATIVE DIAGNOSIS:  Intradural extramedullary thoracic tumor  PROCEDURE:  Procedure(s): Thoracic two - Thoracic four laminectomy with resection of intradural extramedullary tumor (N/A)  SURGEON:  Surgeon(s) and Role:    Earnie Larsson, MD - Primary    * Vallarie Mare, MD - Assisting  PHYSICIAN ASSISTANT:   ASSISTANTSLucia Gaskins   ANESTHESIA:   general  EBL:  100 mL   BLOOD ADMINISTERED:none  DRAINS: none   LOCAL MEDICATIONS USED:  MARCAINE     SPECIMEN: Intradural extramedullary tumor at T3  DISPOSITION OF SPECIMEN:  PATHOLOGY  COUNTS:  YES  TOURNIQUET:  * No tourniquets in log *  DICTATION: .Dragon Dictation  PLAN OF CARE: Admit to inpatient   PATIENT DISPOSITION:  PACU - hemodynamically stable.   Delay start of Pharmacological VTE agent (>24hrs) due to surgical blood loss or risk of bleeding: yes

## 2021-03-08 NOTE — Progress Notes (Signed)
Postop check.  Patient awake and alert.  She is oriented and appropriate.  Her speech is fluent.  She complains of some neck pain and incisional pain.  No lower extremity numbness paresthesias or weakness.  She is hemodynamically stable.  Motor and sensory function are intact.  Wound dressing clean and dry.  Overall doing well following laminectomy and resection of intradural tumor.  Mobilize tonight and tomorrow.  Likely discharge on Sunday.

## 2021-03-08 NOTE — Anesthesia Procedure Notes (Signed)
Procedure Name: Intubation Date/Time: 03/08/2021 8:23 AM Performed by: Moshe Salisbury, CRNA Pre-anesthesia Checklist: Patient identified, Emergency Drugs available, Suction available and Patient being monitored Patient Re-evaluated:Patient Re-evaluated prior to induction Oxygen Delivery Method: Circle System Utilized Preoxygenation: Pre-oxygenation with 100% oxygen Induction Type: IV induction Ventilation: Mask ventilation without difficulty Laryngoscope Size: Mac and 3 Grade View: Grade II Tube type: Oral Tube size: 7.5 mm Number of attempts: 1 Airway Equipment and Method: Stylet Placement Confirmation: ETT inserted through vocal cords under direct vision, positive ETCO2 and breath sounds checked- equal and bilateral Secured at: 20 cm Tube secured with: Tape Dental Injury: Teeth and Oropharynx as per pre-operative assessment

## 2021-03-08 NOTE — Op Note (Signed)
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  Asst. Surgeon: Leafy Kindle, NP  Anesthesia: General  Indication: 87 year old female with progressive bilateral lower extremity sensory and motor changes consistent with an early thoracic myelopathy.  Work-up demonstrates evidence of a large T3 intradural extramedullary mass which appears to be dural based and causes severe cord compression.  Patient presents now for laminectomy and resection of tumor.  Operative note: After induction of anesthesia, patient position prone onto bolsters with her head fixed in [place] pin headrest.  Patient's lower thoracic and cervical region prepped and draped sterilely.  Incision made from T4-T2.  Dissection performed bilaterally.  Retractor placed.  X-rays taken.  Level confirmed.  Laminectomy then performed using [place] [facility] and high-speed drill to remove the entire lamina of T2-T3 and the superior aspect lamina of T4.  Ligament flavum elevated and resected.  Microscope brought in field used microdissection of the spinal canal.  The dura was opened in the midline.  The durotomy was then extended both cephalad and caudad.  The dura was retracted laterally and held in place with traction sutures.  The tumor was readily apparent on the left side.  The tumor itself was fixed to the lateral dura and consistent with a meningioma.  This was then dissected free from the lateral dura and dissected free from the spinal cord medially.  This was then removed in a piecemeal fashion.  A gross total resection was achieved however there was 1 small area of dural calcification which was not resected as I felt this would increase the  likelihood of postoperative CSF fistula.  There was no evidence of obvious injury to the spinal cord there.  There was 1 small nerve root root which was sacrificed during the tumor resection.  Hemostasis was good.  Wound was irrigated.  Dura was reapproximated using 4-0 Nurolon in a running fashion.  DuraGen was placed over the dural repair as was fibrin sealant.  Wound is then closed in layers with Vicryl sutures.  Steri-Strips and sterile dressing were applied.  No apparent complications.  Patient tolerated the procedure well and she returns to the recovery room postop. 03/08/2021

## 2021-03-08 NOTE — H&P (Signed)
Gloria Goodman is an 87 y.o. female.   Chief Complaint: Weakness HPI: 87 year old female with progressive bilateral lower extremity weakness and gait instability.  Work-up demonstrates evidence of an upper thoracic extra medullary intradural tumor with spinal cord compression at the T3 level.  Patient tumor most consistent with a meningioma.  No history of moderate metastatic disease.  Patient presents now for surgical resection.  Past Medical History:  Diagnosis Date   Cataract, nuclear sclerotic, left eye 09/12/2016   Chest tightness 03/10/2017   Diverticulosis    DJD (degenerative joint disease) of cervical spine    GAD (generalized anxiety disorder) 04/28/2018   Insomnia    Migraine    Mixed hyperlipidemia    Osteopenia    Ovarian cancer ([place]) 1997   PAT (paroxysmal atrial tachycardia) ([place]) 01/26/2017   Right upper quadrant pain    Vitreous detachment of both eyes     Past Surgical History:  Procedure Laterality Date   BREAST CYST ASPIRATION     CATARACT EXTRACTION Bilateral 2022   COLONOSCOPY  2008   ESOPHAGOGASTRODUODENOSCOPY  08/22/2016   EYE SURGERY     TOTAL ABDOMINAL HYSTERECTOMY W/ BILATERAL SALPINGOOPHORECTOMY  1997   UPPER GASTROINTESTINAL ENDOSCOPY  08/22/2016    Family History  Problem Relation Age of Onset   Breast cancer Maternal Aunt    Social History:  reports that she has quit smoking. Her smoking use included cigarettes. She has never used smokeless tobacco. She reports previous alcohol use. She reports that she does not use drugs.  Allergies:  Allergies  Allergen Reactions   Statins     Pt felt bad all over.    Medications Prior to Admission  Medication Sig Dispense Refill   acetaminophen (TYLENOL) 500 MG tablet Take 1,000 mg by mouth 2 (two) times daily as needed for moderate pain or headache.     ALPRAZolam (XANAX) 0.5 MG tablet Take 0.25-0.5 mg by mouth See admin instructions. Take 0.25 mg in the morning, 0.25 mg in the afternoon, and 0.5  mg at night     aspirin EC 81 MG tablet Take 81 mg by mouth 3 (three) times a week. Swallow whole.     cholecalciferol (VITAMIN D3) 25 MCG (1000 UNIT) tablet Take 1,000 Units by mouth 3 (three) times a week.     Menthol, Topical Analgesic, (BIOFREEZE EX) Apply 1 application topically daily as needed (muscle pain).     Omega-3 Fatty Acids (FISH OIL) 1200 MG CAPS Take 1,200 mg by mouth 3 (three) times a week.     Polyethyl Glycol-Propyl Glycol (SYSTANE OP) Place 1 drop into both eyes daily as needed (dry eyes).     sodium chloride (OCEAN) 0.65 % SOLN nasal spray Place 1 spray into both nostrils as needed for congestion.     calcium carbonate (TUMS - DOSED IN MG ELEMENTAL CALCIUM) 500 MG chewable tablet Chew 500-1,000 mg by mouth daily as needed for indigestion or heartburn.      No results found for this or any previous visit (from the past 48 hour(s)). No results found.  Pertinent items noted in HPI and remainder of comprehensive ROS otherwise negative.  Blood pressure (!) 152/69, pulse 74, temperature (!) 97.5 F (36.4 C), resp. rate 18, height '5\' 1"'$  (1.549 m), weight 54.4 kg, SpO2 98 %.  Patient is awake and alert.  She is oriented and appropriate.  Speech is fluent.  Judgment and insight are intact.  Cranial nerve function normal bilateral.  Motor examination the upper extremities  5/5.  Motor examination of the lower extremities 4+/5 with some mildly increased tone.  Reflexes are increased in both lower extremities.  Toes are upgoing.  Gait is ataxic and somewhat spastic.  Examination head ears eyes nose throat is unremarkable chest and abdomen are benign.  Extremities are free from injury or deformity. Assessment/Plan T3 intradural extra medullary spinal tumor with significant cord compression.  Plan T2-T4 laminectomy with resection of intradural extramedullary neoplasm.  Risks and benefits been explained.  Patient wishes to proceed.  Mallie Mussel A Reid Regas 03/08/2021, 7:59 AM

## 2021-03-08 NOTE — Transfer of Care (Signed)
Immediate Anesthesia Transfer of Care Note  Patient: Gloria Goodman  Procedure(s) Performed: Thoracic two - Thoracic four laminectomy with resection of intradural extramedullary tumor (Spine Thoracic)  Patient Location: PACU  Anesthesia Type:General  Level of Consciousness: drowsy and patient cooperative  Airway & Oxygen Therapy: Patient Spontanous Breathing and Patient connected to nasal cannula oxygen  Post-op Assessment: Report given to RN, Post -op Vital signs reviewed and stable and Patient moving all extremities  Post vital signs: Reviewed and stable  Last Vitals:  Vitals Value Taken Time  BP    Temp    Pulse 68 03/08/21 1037  Resp 11 03/08/21 1037  SpO2 100 % 03/08/21 1037  Vitals shown include unvalidated device data.  Last Pain:  Vitals:   03/08/21 0635  PainSc: 0-No pain      Patients Stated Pain Goal: 2 ([phone])  Complications: No notable events documented.

## 2021-03-09 NOTE — Evaluation (Signed)
Occupational Therapy Evaluation Patient Details Name: Gloria Goodman MRN: [id number] DOB: 1932-09-10 Today's Date: 03/09/2021    History of Present Illness Pt is a 87 y.o. F who presents with BLE weakness and gait instability. 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 S/p T2-T3-T4 laminectomy with resection of intradural extramedullary tumor, microdissection 03/08/2021. Significant PMH: cataracts, ovarian CA.   Clinical Impression   Pt PTA: Pt was independent wth ADL and mobility. Pt currently limited by decreased activity toleranceand decreased ability to care for self. Pt set-upA to minguardA for ADL and minA overall for mobility with RW. Back handout provided and reviewed ADL in detail. Pt educated on: set an alarm at night for medication, avoid sitting for long periods of time, correct bed positioning for sleeping, correct sequence for bed mobility, avoiding lifting more than 5 pounds and never wash directly over incision. Pt would benefit from continued OT skilled services. OT following acutely.     Follow Up Recommendations  No OT follow up;Supervision - Intermittent    Equipment Recommendations  3 in 1 bedside commode    Recommendations for Other Services       Precautions / Restrictions Precautions Precautions: Back;Fall Precaution Booklet Issued: Yes (comment) Precaution Comments: verbally reviewed, provided written handout Restrictions Weight Bearing Restrictions: No      Mobility Bed Mobility Overal bed mobility: Modified Independent             General bed mobility comments: Cues for log roll technique, HOB flat, use of rail    Transfers Overall transfer level: Needs assistance Equipment used: Rolling walker (2 wheeled);None Transfers: Sit to/from Stand Sit to Stand: Min assist         General transfer comment: Light minA to rise and steady, cues for hand  placement    Balance Overall balance assessment: Needs assistance Sitting-balance support: Feet supported Sitting balance-Leahy Scale: Fair     Standing balance support: Single extremity supported;During functional activity Standing balance-Leahy Scale: Poor Standing balance comment: reliant on at least single UE support                           ADL either performed or assessed with clinical judgement   ADL Overall ADL's : At baseline                     Lower Body Dressing: Supervision/safety;Cueing for sequencing;Adhering to back precautions Lower Body Dressing Details (indicate cue type and reason): figure four technique ability             Functional mobility during ADLs: Min guard;Rolling walker;Cueing for safety General ADL Comments: Pt bumping onto items on L, was using a larger RW. Pt limited by decreased activity toleranceand decreased ability to care for self.     Vision Baseline Vision/History: Wears glasses Wears Glasses: Reading only Patient Visual Report: No change from baseline Vision Assessment?: No apparent visual deficits     Perception     Praxis      Pertinent Vitals/Pain Pain Assessment: Faces Faces Pain Scale: Hurts a little bit Pain Location: surgical site Pain Descriptors / Indicators: Operative site guarding Pain Intervention(s): Monitored during session     Hand Dominance Right   Extremity/Trunk Assessment Upper Extremity Assessment Upper Extremity Assessment: Overall WFL for tasks assessed   Lower Extremity Assessment Lower Extremity Assessment: RLE deficits/detail;LLE deficits/detail RLE Deficits / Details: grossly 4/5  LLE Deficits / Details: grossly 4/5   Cervical / Trunk Assessment Cervical / Trunk Assessment: Other exceptions Cervical / Trunk Exceptions: s/p lumbar sx   Communication Communication Communication: No difficulties   Cognition Arousal/Alertness: Awake/alert Behavior During Therapy: WFL  for tasks assessed/performed Overall Cognitive Status: Within Functional Limits for tasks assessed                                     General Comments  Pt's daughter in room,    Exercises     Shoulder Instructions      Home Living Family/patient expects to be discharged to:: Private residence Living Arrangements: Spouse/significant other Available Help at Discharge: Family;Available 24 hours/day Type of Home: House Home Access: Stairs to enter [employer] of Steps: 1+1   Home Layout: One level     Bathroom Shower/Tub: Tub/shower unit;Walk-in shower   Bathroom Toilet: Standard     Home Equipment: Cane - single point          Prior Functioning/Environment Level of Independence: Independent        Comments: reports driving        OT Problem List: Decreased strength;Decreased activity tolerance;Impaired balance (sitting and/or standing);Pain;Decreased knowledge of precautions      OT Treatment/Interventions: Self-care/ADL training;Energy conservation;DME and/or AE instruction;Therapeutic activities;Patient/family education    OT Goals(Current goals can be found in the care plan section) Acute Rehab OT Goals Patient Stated Goal: return to independence, healing from surgery OT Goal Formulation: With patient Time For Goal Achievement: 03/23/21 Potential to Achieve Goals: Good ADL Goals Pt Will Perform Lower Body Dressing: with set-up;sit to/from stand;sitting/lateral leans;with adaptive equipment Additional ADL Goal #1: Pt will increase to supervision A for OOB ADL tasks with minimal cues for spinal precautions.  OT Frequency: Min 2X/week   Barriers to D/C:            Co-evaluation              AM-PAC OT "6 Clicks" Daily Activity     Outcome Measure Help from another person eating meals?: None Help from another person taking care of personal grooming?: A Little Help from another person toileting, which includes using  toliet, bedpan, or urinal?: A Little Help from another person bathing (including washing, rinsing, drying)?: A Little Help from another person to put on and taking off regular upper body clothing?: None Help from another person to put on and taking off regular lower body clothing?: A Little 6 Click Score: 20   End of Session Equipment Utilized During Treatment: Gait belt;Rolling walker Nurse Communication: Mobility status  Activity Tolerance: Patient limited by pain Patient left: in chair;with call bell/phone within reach  OT Visit Diagnosis: Unsteadiness on feet (R26.81);Muscle weakness (generalized) (M62.81);Pain Pain - part of body:  (back)                Time: [id number] OT Time Calculation (min): 17 min Charges:  OT General Charges $OT Visit: 1 Visit OT Evaluation $OT Eval Low Complexity: 1 Low  Jefferey Pica, OTR/L Acute Rehabilitation Services Pager: [phone] Office: [place] 03/09/2021, 4:29 PM

## 2021-03-09 NOTE — Evaluation (Signed)
Physical Therapy Evaluation Patient Details Name: Gloria Goodman MRN: [id number] DOB: 01-18-1933 Today's Date: 03/09/2021   History of Present Illness  Pt is a 87 y.o. F who presents with BLE weakness and gait instability. 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 S/p T2-T3-T4 laminectomy with resection of intradural extramedullary tumor, microdissection 03/08/2021. Significant PMH: cataracts, ovarian CA.  Clinical Impression  PTA, pt lives with her spouse and is independent. Pt pleasant and motivated to participate. Pt verbalizing good pain control; denies numbness/tingling. Ambulating x 100 feet with a walker at a min guard assist level. Benefits from external support of assistive device to increase stability. Pt presents with mild BLE weakness and balance deficits. Education provided regarding spinal precautions. Would benefit from follow up OPPT to address deficits and maximize functional independence.     Follow Up Recommendations Outpatient PT;Supervision for mobility/OOB    Equipment Recommendations  Rolling walker with 5" wheels;3in1 (PT)    Recommendations for Other Services       Precautions / Restrictions Precautions Precautions: Back;Fall Precaution Booklet Issued: Yes (comment) Precaution Comments: verbally reviewed, provided written handout Restrictions Weight Bearing Restrictions: No      Mobility  Bed Mobility Overal bed mobility: Modified Independent             General bed mobility comments: Cues for log roll technique, HOB flat, use of rail    Transfers Overall transfer level: Needs assistance Equipment used: Rolling walker (2 wheeled);None Transfers: Sit to/from Stand Sit to Stand: Min assist         General transfer comment: Light minA to rise and steady, cues for hand placement  Ambulation/Gait Ambulation/Gait assistance: Min assist;Min guard Gait  Distance (Feet): 100 Feet Assistive device: Rolling walker (2 wheeled);2 person hand held assist Gait Pattern/deviations: Step-through pattern;Decreased stride length;Narrow base of support Gait velocity: decreased Gait velocity interpretation: <1.8 ft/sec, indicate of risk for recurrent falls General Gait Details: Pt requiring minA for stability with no AD, progressing to min guard with RW. Slow and guarded pace. No overt LOB  Stairs            Wheelchair Mobility    Modified Rankin (Stroke Patients Only)       Balance Overall balance assessment: Needs assistance Sitting-balance support: Feet supported Sitting balance-Leahy Scale: Fair     Standing balance support: Single extremity supported;During functional activity Standing balance-Leahy Scale: Poor Standing balance comment: reliant on at least single UE support                             Pertinent Vitals/Pain Pain Assessment: Faces Faces Pain Scale: Hurts a little bit Pain Location: surgical site Pain Descriptors / Indicators: Operative site guarding Pain Intervention(s): Monitored during session    Home Living Family/patient expects to be discharged to:: Private residence Living Arrangements: Spouse/significant other Available Help at Discharge: Family;Available 24 hours/day Type of Home: House Home Access: Stairs to enter   [employer] of Steps: 1+1 Home Layout: One level Home Equipment: Cane - single point      Prior Function Level of Independence: Independent         Comments: reports driving     Hand Dominance   Dominant Hand: Right    Extremity/Trunk Assessment   Upper Extremity Assessment Upper Extremity Assessment: Defer to OT evaluation    Lower Extremity Assessment Lower Extremity Assessment: RLE deficits/detail;LLE deficits/detail  RLE Deficits / Details: grossly 4/5 LLE Deficits / Details: grossly 4/5    Cervical / Trunk Assessment Cervical / Trunk  Assessment: Other exceptions Cervical / Trunk Exceptions: s/p lumbar sx  Communication   Communication: No difficulties  Cognition Arousal/Alertness: Awake/alert Behavior During Therapy: WFL for tasks assessed/performed Overall Cognitive Status: Within Functional Limits for tasks assessed                                        General Comments      Exercises     Assessment/Plan    PT Assessment Patient needs continued PT services  PT Problem List Decreased strength;Decreased activity tolerance;Decreased balance;Decreased mobility;Pain       PT Treatment Interventions DME instruction;Gait training;Functional mobility training;Stair training;Therapeutic activities;Therapeutic exercise;Balance training;Patient/family education    PT Goals (Current goals can be found in the Care Plan section)  Acute Rehab PT Goals Patient Stated Goal: return to independence, healing from surgery PT Goal Formulation: With patient/family Time For Goal Achievement: 03/23/21 Potential to Achieve Goals: Good    Frequency Min 5X/week   Barriers to discharge        Co-evaluation               AM-PAC PT "6 Clicks" Mobility  Outcome Measure Help needed turning from your back to your side while in a flat bed without using bedrails?: None Help needed moving from lying on your back to sitting on the side of a flat bed without using bedrails?: None Help needed moving to and from a bed to a chair (including a wheelchair)?: A Little Help needed standing up from a chair using your arms (e.g., wheelchair or bedside chair)?: A Little Help needed to walk in hospital room?: A Little Help needed climbing 3-5 steps with a railing? : A Little 6 Click Score: 20    End of Session Equipment Utilized During Treatment: Gait belt Activity Tolerance: Patient tolerated treatment well Patient left: in chair;with call bell/phone within reach;with chair alarm set;with family/visitor  present Nurse Communication: Mobility status PT Visit Diagnosis: Unsteadiness on feet (R26.81);Pain Pain - part of body:  (back)    Time: [id number] PT Time Calculation (min) (ACUTE ONLY): 17 min   Charges:   PT Evaluation $PT Eval Low Complexity: [place], PT, DPT Acute Rehabilitation Services Pager [phone] Office [phone]   Deno Etienne 03/09/2021, 1:52 PM

## 2021-03-09 NOTE — Progress Notes (Signed)
1707 Patient states her hands began trembling after surgery. This is a new experience for her and today it has happened 3 times, each occurrence lasting approx 10 seconds each. Patient states she doesn't feel any pain during trembling.   Shelbie Proctor, RN

## 2021-03-09 NOTE — Progress Notes (Signed)
Subjective: Patient reports well-controlled back pain, no weakness in legs.  Walked short distance this morning.  Objective: Vital signs in last 24 hours: Temp:  [97.5 F (36.4 C)-98.3 F (36.8 C)] 97.8 F (36.6 C) (07/30 0800) Pulse Rate:  [50-89] 68 (07/30 0800) Resp:  [10-20] 16 (07/30 0800) BP: (95-131)/(41-69) 97/51 (07/30 0800) SpO2:  [94 %-100 %] 95 % (07/30 0800)  Intake/Output from previous day: 07/29 0701 - 07/30 0700 In: 664.7 [I.V.:614.7; IV Piggyback:50] Out: 175 [Urine:75; Blood:100] Intake/Output this shift: No intake/output data recorded.  Lying in bed, comfortable HF, KE, PF full strength Dressing c/d  Lab Results: No results for input(s): WBC, HGB, HCT, PLT in the last 72 hours. BMET No results for input(s): NA, K, CL, CO2, GLUCOSE, BUN, CREATININE, CALCIUM in the last 72 hours.  Studies/Results: DG Thoracic Spine 1 View  Result Date: 03/08/2021 CLINICAL DATA:  Surgery, elective Z41.9 (ICD-10-CM). Additional history provided by technologist: Location for thoracic 2-thoracic 4 laminectomy with resection of intradural extramedullary tumor. Provided fluoroscopy time 1 second (0.14 mGy). EXAM: OPERATIVE THORACIC SPINE, SINGLE VIEW COMPARISON:  Thoracic spine MRI 02/06/2021. FINDINGS: A single AP view intraoperative fluoroscopic images of the cervicothoracic spine is submitted. On the provided image, surgical instruments and retractors project over the upper thoracic spine. An ET tube terminates at the level of the midthoracic trachea. Partially visualized enteric tube. Correlate with the operative history. IMPRESSION: Single AP view intraoperative fluoroscopic image of the cervicothoracic spine, as described. Correlate with the operative history. Electronically Signed   By: Kellie Simmering DO   On: 03/08/2021 11:27   DG C-Arm 1-60 Min  Result Date: 03/08/2021 CLINICAL DATA:  Surgery, elective Z41.9 (ICD-10-CM). Additional history provided by technologist: Location for  thoracic 2-thoracic 4 laminectomy with resection of intradural extramedullary tumor. Provided fluoroscopy time 1 second (0.14 mGy). EXAM: OPERATIVE THORACIC SPINE, SINGLE VIEW COMPARISON:  Thoracic spine MRI 02/06/2021. FINDINGS: A single AP view intraoperative fluoroscopic images of the cervicothoracic spine is submitted. On the provided image, surgical instruments and retractors project over the upper thoracic spine. An ET tube terminates at the level of the midthoracic trachea. Partially visualized enteric tube. Correlate with the operative history. IMPRESSION: Single AP view intraoperative fluoroscopic image of the cervicothoracic spine, as described. Correlate with the operative history. Electronically Signed   By: Kellie Simmering DO   On: 03/08/2021 11:27    Assessment/Plan: S/p resection of thoracic intradural meningioma - continue mobilization - likely discharge tomorrow - SCDs   Vallarie Mare 03/09/2021, 10:39 AM

## 2021-03-10 MED ORDER — HYDROCODONE-ACETAMINOPHEN 5-325 MG PO TABS: 1.0000 | ORAL_TABLET | ORAL | 0 refills | Status: DC | PRN

## 2021-03-10 MED ORDER — CYCLOBENZAPRINE HCL 10 MG PO TABS: 10.0000 mg | ORAL_TABLET | Freq: Three times a day (TID) | ORAL | 0 refills | Status: DC | PRN

## 2021-03-10 NOTE — Progress Notes (Signed)
D/C education given to Pt and Pt's daughter and all questions answered. No printed prescriptions to give. Equipment delivered to room prior to d/c. IV removed. Pt taken to car with all belongings.

## 2021-03-10 NOTE — Discharge Instructions (Signed)
Can remove transparent dressing and shower 03/11/2021 Walk as much as possible No heavy lifting >10 lbs No excessive bending/twisting at the neck

## 2021-03-10 NOTE — Progress Notes (Signed)
Physical Therapy Treatment and Discharge Patient Details Name: Gloria Goodman MRN: [id number] DOB: 1932/09/07 Today's Date: 03/10/2021    History of Present Illness Pt is a 87 y.o. F who presents with BLE weakness and gait instability. 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 S/p T2-T3-T4 laminectomy with resection of intradural extramedullary tumor, microdissection 03/08/2021. Significant PMH: cataracts, ovarian CA.    PT Comments    Pt verbalizing good pain control today. Ambulating x 150 feet with a walker at a supervision level and negotiated 2 steps to simulate home set up. Reviewed cervical retractions for postural re-education and car transfer technique. Pt/pt family with no further questions or concerns. See below for recommendations.    Follow Up Recommendations  Outpatient PT;Supervision for mobility/OOB     Equipment Recommendations  Rolling walker with 5" wheels;3in1 (PT)    Recommendations for Other Services       Precautions / Restrictions Precautions Precautions: Back;Fall Precaution Booklet Issued: Yes (comment) Precaution Comments: verbally reviewed, provided written handout Restrictions Weight Bearing Restrictions: No    Mobility  Bed Mobility               General bed mobility comments: OOB in bathroom with RN upon entry    Transfers Overall transfer level: Modified independent Equipment used: Rolling walker (2 wheeled);None             General transfer comment: cues for hand placement  Ambulation/Gait Ambulation/Gait assistance: Supervision Gait Distance (Feet): 150 Feet Assistive device: Rolling walker (2 wheeled) Gait Pattern/deviations: Step-through pattern;Decreased stride length;Narrow base of support Gait velocity: decreased   General Gait Details: slightly improved gait speed today, forward head posture, supervision for  safety   Stairs Stairs: Yes Stairs assistance: Min guard Stair Management: One rail Right Number of Stairs: 2 General stair comments: Cues for technique   Wheelchair Mobility    Modified Rankin (Stroke Patients Only)       Balance Overall balance assessment: Needs assistance Sitting-balance support: Feet supported Sitting balance-Leahy Scale: Good     Standing balance support: Single extremity supported;During functional activity Standing balance-Leahy Scale: Poor Standing balance comment: reliant on at least single UE support                            Cognition Arousal/Alertness: Awake/alert Behavior During Therapy: WFL for tasks assessed/performed Overall Cognitive Status: Within Functional Limits for tasks assessed                                        Exercises  Cervical retractions (3 s hold) x 5    General Comments        Pertinent Vitals/Pain Pain Assessment: Faces Faces Pain Scale: Hurts a little bit Pain Location: surgical site Pain Descriptors / Indicators: Operative site guarding Pain Intervention(s): Monitored during session    Home Living                      Prior Function            PT Goals (current goals can now be found in the care plan section) Acute Rehab PT Goals Patient Stated Goal: return to independence, healing from surgery PT Goal Formulation: With patient/family Time For Goal Achievement: 03/23/21 Potential to Achieve Goals: Good Progress towards  PT goals: Progressing toward goals;Goals met/education completed, patient discharged from PT    Frequency    Min 5X/week      PT Plan Other (comment) (d/c therapies)    Co-evaluation              AM-PAC PT "6 Clicks" Mobility   Outcome Measure  Help needed turning from your back to your side while in a flat bed without using bedrails?: None Help needed moving from lying on your back to sitting on the side of a flat bed  without using bedrails?: None Help needed moving to and from a bed to a chair (including a wheelchair)?: A Little Help needed standing up from a chair using your arms (e.g., wheelchair or bedside chair)?: None Help needed to walk in hospital room?: A Little Help needed climbing 3-5 steps with a railing? : A Little 6 Click Score: 21    End of Session Equipment Utilized During Treatment: Gait belt Activity Tolerance: Patient tolerated treatment well Patient left: in chair;with call bell/phone within reach;with chair alarm set;with family/visitor present Nurse Communication: Mobility status PT Visit Diagnosis: Unsteadiness on feet (R26.81);Pain Pain - part of body:  (back)     Time: 9079-3109 PT Time Calculation (min) (ACUTE ONLY): 27 min  Charges:  $Gait Training: 8-22 mins $Therapeutic Activity: 8-22 mins                     Wyona Almas, PT, DPT Acute Rehabilitation Services Pager [phone] Office [phone]    Deno Etienne 03/10/2021, 9:26 AM

## 2021-03-10 NOTE — Discharge Summary (Signed)
  Physician Discharge Summary  Patient ID: Gloria Goodman MRN: [id number] DOB/AGE: [birth year] 87 y.o.  Admit date: 03/08/2021 Discharge date: 03/10/2021  Admission Diagnoses:  Intradural extramedullary thoracic spinal mass  Discharge Diagnoses:  Same Active Problems:   Intradural-extramedullary spinal cord neoplasms   Discharged Condition: Stable  Hospital Course:  Cleon Menard is a 87 y.o. female who underwent thoracic lamis T2-4 for resection of intradural extramedullary mass, likely meningioma.  Postoperatively, she was admitted to the PCU and mobilized.  She was tolerating a regular diet, her pain was well-controlled, and she remained neurologically unchanged from preoperatively.  She was deemed ready for discharge home.   Treatments: Surgery -  thoracic lamis T2-4 for resection of intradural extramedullary mass, likely meningioma  Discharge Exam: Blood pressure 132/68, pulse 62, temperature 97.8 F (36.6 C), temperature source Oral, resp. rate 20, height '5\' 1"'$  (1.549 m), weight 54.4 kg, SpO2 98 %. Awake, alert, oriented Speech fluent, appropriate CN grossly intact 5/5 BUE/BLE Wound c/d/i  Disposition: Discharge disposition: 01-Home or Self Care        Allergies as of 03/10/2021       Reactions   Statins    Pt felt bad all over.        Medication List     TAKE these medications    acetaminophen 500 MG tablet Commonly known as: TYLENOL Take 1,000 mg by mouth 2 (two) times daily as needed for moderate pain or headache.   ALPRAZolam 0.5 MG tablet Commonly known as: XANAX Take 0.25-0.5 mg by mouth See admin instructions. Take 0.25 mg in the morning, 0.25 mg in the afternoon, and 0.5 mg at night   aspirin EC 81 MG tablet Take 81 mg by mouth 3 (three) times a week. Swallow whole.   BIOFREEZE EX Apply 1 application topically daily as needed (muscle pain).   calcium carbonate 500 MG chewable tablet Commonly known as: TUMS - dosed in mg elemental  calcium Chew 500-1,000 mg by mouth daily as needed for indigestion or heartburn.   cholecalciferol 25 MCG (1000 UNIT) tablet Commonly known as: VITAMIN D3 Take 1,000 Units by mouth 3 (three) times a week.   cyclobenzaprine 10 MG tablet Commonly known as: FLEXERIL Take 1 tablet (10 mg total) by mouth 3 (three) times daily as needed for muscle spasms.   Fish Oil 1200 MG Caps Take 1,200 mg by mouth 3 (three) times a week.   HYDROcodone-acetaminophen 5-325 MG tablet Commonly known as: NORCO/VICODIN Take 1 tablet by mouth every 4 (four) hours as needed for moderate pain ((score 4 to 6)).   sodium chloride 0.65 % Soln nasal spray Commonly known as: OCEAN Place 1 spray into both nostrils as needed for congestion.   SYSTANE OP Place 1 drop into both eyes daily as needed (dry eyes).        Follow-up Information     Earnie Larsson, MD Follow up in 2 week(s).   Specialty: Neurosurgery Contact information: [address] [place] [address] [phone]                 Signed: Vallarie Mare 03/10/2021, 11:45 AM

## 2021-03-10 NOTE — TOC Initial Note (Signed)
Transition of Care [facility]) - Initial/Assessment Note    Patient Details  Name: Gloria Goodman MRN: [id number] Date of Birth: [birth year]  Transition [facility]) CM/SW Contact:    Bartholomew Crews, RN Phone Number: [phone] 03/10/2021, 12:33 PM  Clinical Narrative:                  Spoke with patient on her room phone. PTAR home with husband and daughter. Discussed recommendations for RW and 3N1 - agreeable. [employer] to deliver to the bedside. Discussed recommendations for OP rehab - referral to [facility] location. Encouraged patient to schedule her appointment. Patient stated that her daughter will pick her up for discharge. No further TOC needs identified.   Expected Discharge Plan: OP Rehab Barriers to Discharge: No Barriers Identified   Patient Goals and CMS Choice Patient states their goals for this hospitalization and ongoing recovery are:: return home with support of her husband and daughter CMS Medicare.gov Compare Post Acute Care list provided to:: Patient Choice offered to / list presented to : Patient  Expected Discharge Plan and Services Expected Discharge Plan: OP Rehab In-house Referral: NA Discharge Planning Services: CM Consult Post Acute Care Choice: Durable Medical Equipment Living arrangements for the past 2 months: Single Family Home Expected Discharge Date: 03/10/21               DME Arranged: Berta Minor rolling DME Agency: [employer] Date DME Agency Contacted: 03/10/21 Time DME Agency Contacted: [phone] Representative spoke with at DME Agency: [place]: NA [place] Agency: NA        Prior Living Arrangements/Services Living arrangements for the past 2 months: [place] with:: Self, Spouse, Adult Children Patient language and need for interpreter reviewed:: Yes Do you feel safe going back to the place where you live?: Yes      Need for Family Participation in Patient Care: Yes (Comment) Care giver support system in place?: Yes  (comment)   Criminal Activity/Legal Involvement Pertinent to Current Situation/Hospitalization: No - Comment as needed  Activities of Daily Living      Permission Sought/Granted                  Emotional Assessment Appearance:: Appears stated age Attitude/Demeanor/Rapport: Engaged Affect (typically observed): Accepting Orientation: : Oriented to Self, Oriented to  Time, Oriented to Place, Oriented to Situation Alcohol / Substance Use: Not Applicable Psych Involvement: No (comment)  Admission diagnosis:  Intradural-extramedullary spinal cord neoplasms [D49.7] Patient Active Problem List   Diagnosis Date Noted   Intradural-extramedullary spinal cord neoplasms 03/08/2021   PCP:  Tomasa Hose, NP Pharmacy:   [id number] [id number]: [phone]: [phone]    Social Determinants of Health (SDOH) Interventions    Readmission Risk Interventions No flowsheet data found.

## 2021-03-11 ENCOUNTER — Encounter (HOSPITAL_COMMUNITY): Payer: Self-pay | Admitting: Neurosurgery

## 2021-03-11 LAB — SURGICAL PATHOLOGY

## 2021-03-11 MED FILL — Thrombin For Soln 5000 Unit: CUTANEOUS | Qty: 5000 | Status: AC

## 2021-03-11 NOTE — Anesthesia Postprocedure Evaluation (Signed)
Anesthesia Post Note  Patient: Gloria Goodman  Procedure(s) Performed: Thoracic two - Thoracic four laminectomy with resection of intradural extramedullary tumor (Spine Thoracic)     Patient location during evaluation: PACU Anesthesia Type: General Level of consciousness: awake and alert Pain management: pain level controlled Vital Signs Assessment: post-procedure vital signs reviewed and stable Respiratory status: spontaneous breathing, nonlabored ventilation, respiratory function stable and patient connected to nasal cannula oxygen Cardiovascular status: blood pressure returned to baseline and stable Postop Assessment: no apparent nausea or vomiting Anesthetic complications: no   No notable events documented.  Last Vitals:  Vitals:   03/10/21 0745 03/10/21 1111  BP: (!) 114/58 132/68  Pulse: 66 62  Resp: 18 20  Temp: 36.6 C 36.6 C  SpO2: 98% 98%    Last Pain:  Vitals:   03/10/21 1111  TempSrc: Oral  PainSc:                  Salimatou Simone

## 2021-03-14 ENCOUNTER — Other Ambulatory Visit: Payer: Self-pay

## 2021-03-14 ENCOUNTER — Ambulatory Visit: Payer: Medicare HMO | Attending: Neurosurgery | Admitting: Physical Therapy

## 2021-03-14 ENCOUNTER — Encounter: Payer: Self-pay | Admitting: Physical Therapy

## 2021-03-14 DIAGNOSIS — R293 Abnormal posture: Secondary | ICD-10-CM | POA: Diagnosis present

## 2021-03-14 DIAGNOSIS — M546 Pain in thoracic spine: Secondary | ICD-10-CM

## 2021-03-14 DIAGNOSIS — R2689 Other abnormalities of gait and mobility: Secondary | ICD-10-CM | POA: Diagnosis present

## 2021-03-14 DIAGNOSIS — M542 Cervicalgia: Secondary | ICD-10-CM

## 2021-03-14 DIAGNOSIS — M6281 Muscle weakness (generalized): Secondary | ICD-10-CM | POA: Diagnosis present

## 2021-03-14 NOTE — Therapy (Signed)
[place], [place], [address] Phone: [phone]   Fax:  [phone]  Physical Therapy Treatment  Patient Details  Name: Gloria Goodman MRN: [id number] Date of Birth: Nov 07, 1932 Referring Provider (PT): Earnie Larsson MD   Encounter Date: 03/14/2021   PT End of Session - 03/14/21 1510     Visit Number 1    Number of Visits 17    Date for PT Re-Evaluation 05/09/21    Authorization Type A[employer]na MCR: Kx Mod at 15th , FOTO 6th and 10th visit    Progress Note Due on Visit 10    PT Start Time 1505    PT Stop Time 1546    PT Time Calculation (min) 41 min    Activity Tolerance Patient tolerated treatment well    Behavior During Therapy [facility] for tasks assessed/performed             Past Medical History:  Diagnosis Date   Cataract, nuclear sclerotic, left eye 09/12/2016   Chest tightness 03/10/2017   Diverticulosis    DJD (degenerative joint disease) of cervical spine    GAD (generalized anxiety disorder) 04/28/2018   Insomnia    Migraine    Mixed hyperlipidemia    Osteopenia    Ovarian cancer ([place]) 1997   PAT (paroxysmal atrial tachycardia) ([place]) 01/26/2017   Right upper quadrant pain    Vitreous detachment of both eyes     Past Surgical History:  Procedure Laterality Date   BREAST CYST ASPIRATION     CATARACT EXTRACTION Bilateral 2022   COLONOSCOPY  2008   ESOPHAGOGASTRODUODENOSCOPY  08/22/2016   EYE SURGERY     LAMINECTOMY N/A 03/08/2021   Procedure: Thoracic two - Thoracic four laminectomy with resection of intradural extramedullary tumor;  Surgeon: Earnie Larsson, MD;  Location: [place];  Service: Neurosurgery;  Laterality: N/A;   TOTAL ABDOMINAL HYSTERECTOMY W/ BILATERAL SALPINGOOPHORECTOMY  1997   UPPER GASTROINTESTINAL ENDOSCOPY  08/22/2016    There were no vitals filed for this visit.   Subjective Assessment - 03/14/21 1514     Subjective pt is 87 y.o s.p T2-T4 laminectomy on 03/08/2021. Since  surgey everything has been going well. She reports tightness and pain inthe back of the shoulders and along the incision site. She does report having some weakness. she currently amb with a RW, and denies any falls  or other issues.    How long can you sit comfortably? 20 min    How long can you stand comfortably? 5 min    How long can you walk comfortably? 10 min with RW    Patient Stated Goals know what to do to help herself, return to daily activities as normal as possible.    Currently in Pain? Yes    Pain Score 4    last took meds for pain at 12:30   Pain Location Thoracic    Pain Orientation Right;Upper;Left    Pain Descriptors / Indicators Aching;Sore;Tightness    Pain Type Surgical pain    Pain Onset More than a month ago    Pain Frequency Constant    Aggravating Factors  inactivity, getting out of bed in the AM    Pain Relieving Factors moving around.                [facility] PT Assessment - 03/14/21 0001       Assessment   Medical Diagnosis Intradural-extramedullary spinal cord neoplasms    Referring Provider (PT) Earnie Larsson MD  Onset Date/Surgical Date 03/08/21    Hand Dominance Right    Prior Therapy yes      Precautions   Precautions Back    Precaution Comments Bending, lifting twisting,      Balance Screen   Has the patient fallen in the past 6 months No    Has the patient had a decrease in activity level because of a fear of falling?  No      Home Environment   Living Environment Private residence    Living Arrangements Spouse/significant other    Available Help at Discharge Family    Type of [place]      Observation/Other Assessments   Focus on Therapeutic Outcomes (FOTO)  43%   61% predicted     Posture/Postural Control   Posture/Postural Control Postural limitations    Postural Limitations Rounded Shoulders;Forward head      ROM / Strength   AROM / PROM / Strength AROM;Strength      AROM   Overall AROM  Unable to assess;Due to precautions     AROM Assessment Site Thoracic      Strength   Strength Assessment Site Shoulder;Hip;Knee    Right/Left Shoulder Right;Left    Right Shoulder Flexion 4-/5    Right Shoulder Extension 4/5    Right Shoulder ABduction 4-/5    Right Shoulder Internal Rotation 4+/5    Right Shoulder External Rotation 4+/5    Left Shoulder Flexion 4-/5    Left Shoulder Extension 4/5    Left Shoulder ABduction 4-/5    Left Shoulder External Rotation 4/5    Right/Left Hip Right;Left    Right Hip Flexion 4-/5    Right Hip Extension 4-/5    Right Hip ABduction 3+/5    Left Hip Flexion 4-/5    Left Hip Extension 4-/5    Left Hip ABduction 3+/5    Right/Left Knee Right;Left    Right Knee Flexion 4+/5    Right Knee Extension 4+/5    Left Knee Flexion 4+/5    Left Knee Extension 4+/5      Ambulation/Gait   Ambulation/Gait Yes    Assistive device Rolling walker    Gait Pattern Step-through pattern;Decreased stride length      Standardized Balance Assessment   Standardized Balance Assessment Five Times Sit to Stand    Five times sit to stand comments  19                                   PT Education - 03/14/21 1523     Education Details evaluation findings, POC, goals, HEP with proper form/ rationale. anatomy of the area involved. FOTO assessment    Person(s) Educated Patient    Methods Explanation;Verbal cues;Handout    Comprehension Verbalized understanding;Verbal cues required              PT Short Term Goals - 03/14/21 1755       PT SHORT TERM GOAL #1   Title pt to be IND with initial HEP    Time 4    Period Weeks    Status New    Target Date 04/11/21      PT SHORT TERM GOAL #2   Title pt to verbalize/ demo efficient posture and lifting mechancis to reduce and prevent back and neck pain    Time 4    Period Weeks    Status New  Target Date 04/11/21      PT SHORT TERM GOAL #3   Title pt to be able to amb for >/= 20 min with SPC or less with no  report of instability or pain    Time 4    Period Weeks    Status New    Target Date 04/11/21      PT SHORT TERM GOAL #4   Title increase 5 x STS to </= 14 sec to demo improved in mobility    Time 4    Period Weeks    Target Date 04/11/21               PT Long Term Goals - 03/14/21 1755       PT LONG TERM GOAL #1   Title increase gross bil LE strength >/= 4+/5 to promote stability with standing/ walking    Time 8    Period Weeks    Status New    Target Date 05/09/21      PT LONG TERM GOAL #2   Title increase bil UE strenght to >/= 4+/5 to assiwth with functional lifting and ADLS with on report of pain or limitations    Time 8    Period Weeks    Status New    Target Date 05/09/21      PT LONG TERM GOAL #3   Title pt to be able to stand and walk for >/=45 min with LRAD for functional endurance required for ADLS and short community amb    Time 8    Period Weeks    Status New    Target Date 05/09/21      PT LONG TERM GOAL #4   Title increase FOTO score to >/=61% limited to demo improvement in function    Time 8    Period Weeks    Status New    Target Date 05/09/21      PT LONG TERM GOAL #5   Title pt to be IND with all HEP and is able to maintain and progress current LOF IND    Time 8    Period Weeks    Status New    Target Date 05/09/21                   Plan - 03/14/21 1523     Clinical Impression Statement pt is pleasant 87 y.o F presenting OPPT s/p T2-T4 laminectomy with tumor removal on 03/08/2021. She demonstrates gross weakness in the the hips, and mild weakness in bil UE. Held assessing trunk mobility due to the recent date of the surgery. She currently amb with a RW utilzing a step through pattern. she performed 5 x sit to stand in 19 seconds. She would benfit from physical therapy to increase gross UE/LE strength, promote trunk mobility, gait efficency without AD and maximize her function by addressing the defiticits listed.    Personal  Factors and Comorbidities Comorbidity 2    Comorbidities hx of CX, anxiety, osteopenia    Examination-Activity Limitations Stand;Stairs;Squat;Sit    Stability/Clinical Decision Making Evolving/Moderate complexity    Clinical Decision Making Moderate    PT Frequency 2x / week    PT Duration 8 weeks    PT Treatment/Interventions ADLs/Self Care Home Management;Electrical Stimulation;Iontophoresis '4mg'$ /ml Dexamethasone;Moist Heat;Stair training;Gait training;Functional mobility training;Therapeutic activities;Therapeutic exercise;Balance training;Neuromuscular re-education;Manual techniques;Patient/family education;Passive range of motion;Dry needling;Vasopneumatic Device    PT Next Visit Plan review/ update HEP. limit BLT,  gross hip strengthening, gait training/ endurance training.  PT Home Exercise Plan [phone] - scapular retraction, chin tuck, seated ab set, sit to stand, supine clamshell    Consulted and Agree with Plan of Care Patient             Patient will benefit from skilled therapeutic intervention in order to improve the following deficits and impairments:  Improper body mechanics, Increased muscle spasms, Decreased strength, Pain, Postural dysfunction, Decreased activity tolerance, Decreased balance, Decreased endurance, Decreased range of motion  Visit Diagnosis: Muscle weakness (generalized)  Other abnormalities of gait and mobility  Abnormal posture  Pain in thoracic spine  Cervicalgia     Problem List Patient Active Problem List   Diagnosis Date Noted   Intradural-extramedullary spinal cord neoplasms 03/08/2021   [place] PT, DPT, LAT, ATC  03/14/21  6:01 PM      [facility] [address] [place], [place], [address] Phone: [phone]   Fax:  [phone]  Name: Marjoria Miyashiro MRN: [id number] Date of Birth: 1933-02-16

## 2021-03-15 ENCOUNTER — Encounter: Payer: Self-pay | Admitting: Physical Therapy

## 2021-03-18 ENCOUNTER — Ambulatory Visit: Payer: Medicare HMO | Admitting: Physical Therapy

## 2021-03-18 ENCOUNTER — Other Ambulatory Visit: Payer: Self-pay

## 2021-03-18 ENCOUNTER — Encounter: Payer: Self-pay | Admitting: Physical Therapy

## 2021-03-18 DIAGNOSIS — M6281 Muscle weakness (generalized): Secondary | ICD-10-CM | POA: Diagnosis not present

## 2021-03-18 DIAGNOSIS — M546 Pain in thoracic spine: Secondary | ICD-10-CM

## 2021-03-18 DIAGNOSIS — R293 Abnormal posture: Secondary | ICD-10-CM

## 2021-03-18 DIAGNOSIS — R2689 Other abnormalities of gait and mobility: Secondary | ICD-10-CM

## 2021-03-18 NOTE — Therapy (Signed)
[place], [place], [address] Phone: [phone]   Fax:  [phone]  Physical Therapy Treatment  Patient Details  Name: Gloria Goodman MRN: [id number] Date of Birth: 1933/02/01 Referring Provider (PT): Earnie Larsson MD   Encounter Date: 03/18/2021   PT End of Session - 03/18/21 1239     Visit Number 2    Number of Visits 17    Date for PT Re-Evaluation 05/09/21    Authorization Type Aetna MCR: Kx Mod at 15th , FOTO 6th and 10th visit    Progress Note Due on Visit 10    PT Start Time 1230    PT Stop Time [id number]    PT Time Calculation (min) 45 min             Past Medical History:  Diagnosis Date   Cataract, nuclear sclerotic, left eye 09/12/2016   Chest tightness 03/10/2017   Diverticulosis    DJD (degenerative joint disease) of cervical spine    GAD (generalized anxiety disorder) 04/28/2018   Insomnia    Migraine    Mixed hyperlipidemia    Osteopenia    Ovarian cancer ([place]) 1997   PAT (paroxysmal atrial tachycardia) ([place]) 01/26/2017   Right upper quadrant pain    Vitreous detachment of both eyes     Past Surgical History:  Procedure Laterality Date   BREAST CYST ASPIRATION     CATARACT EXTRACTION Bilateral 2022   COLONOSCOPY  2008   ESOPHAGOGASTRODUODENOSCOPY  08/22/2016   EYE SURGERY     LAMINECTOMY N/A 03/08/2021   Procedure: Thoracic two - Thoracic four laminectomy with resection of intradural extramedullary tumor;  Surgeon: Earnie Larsson, MD;  Location: [place];  Service: Neurosurgery;  Laterality: N/A;   TOTAL ABDOMINAL HYSTERECTOMY W/ BILATERAL SALPINGOOPHORECTOMY  1997   UPPER GASTROINTESTINAL ENDOSCOPY  08/22/2016    There were no vitals filed for this visit.   Subjective Assessment - 03/18/21 1232     Subjective Pt reports she had a bad day yesterday with more pain in upper traps and woke up last night with pain along upper traps and under her shoulde blades. I took pain meds  which helped and I took tylenol today before I came here.    Currently in Pain? No/denies                               [facility] Adult PT Treatment/Exercise - 03/18/21 0001       Ambulation/Gait   Ambulation Distance (Feet) 435 Feet   3 min 20 sec   Assistive device Rolling walker    Gait Pattern Step-through pattern;Decreased stride length      Lumbar Exercises: Aerobic   Nustep L3 (reduced to L1 at 3 minutes due to leg fatigue) x 6 minutes total      Lumbar Exercises: Seated   Sit to Stand 10 reps    Sit to Stand Limitations 2 sets    Other Seated Lumbar Exercises ab draw in with breathing, Chin tuck 5 sec x 10, scap retract 5 sec x 10 , upper trap stretch 3 x 30 seconds each way, levator stretch 3 x 30 sec each      Lumbar Exercises: Supine   Clam 10 reps    Clam Limitations red band 2 sets    Bent Knee Raise 10 reps    Bent Knee Raise Limitations red band, 2 sets    Bridge  10 reps    Bridge Limitations 2 sets                    PT Education - 03/18/21 1309     Education Details HEP    Person(s) Educated Patient    Methods Explanation    Comprehension Verbalized understanding              PT Short Term Goals - 03/14/21 1755       PT SHORT TERM GOAL #1   Title pt to be IND with initial HEP    Time 4    Period Weeks    Status New    Target Date 04/11/21      PT SHORT TERM GOAL #2   Title pt to verbalize/ demo efficient posture and lifting mechancis to reduce and prevent back and neck pain    Time 4    Period Weeks    Status New    Target Date 04/11/21      PT SHORT TERM GOAL #3   Title pt to be able to amb for >/= 20 min with SPC or less with no report of instability or pain    Time 4    Period Weeks    Status New    Target Date 04/11/21      PT SHORT TERM GOAL #4   Title increase 5 x STS to </= 14 sec to demo improved in mobility    Time 4    Period Weeks    Target Date 04/11/21               PT Long Term  Goals - 03/14/21 1755       PT LONG TERM GOAL #1   Title increase gross bil LE strength >/= 4+/5 to promote stability with standing/ walking    Time 8    Period Weeks    Status New    Target Date 05/09/21      PT LONG TERM GOAL #2   Title increase bil UE strenght to >/= 4+/5 to assiwth with functional lifting and ADLS with on report of pain or limitations    Time 8    Period Weeks    Status New    Target Date 05/09/21      PT LONG TERM GOAL #3   Title pt to be able to stand and walk for >/=45 min with LRAD for functional endurance required for ADLS and short community amb    Time 8    Period Weeks    Status New    Target Date 05/09/21      PT LONG TERM GOAL #4   Title increase FOTO score to >/=61% limited to demo improvement in function    Time 8    Period Weeks    Status New    Target Date 05/09/21      PT LONG TERM GOAL #5   Title pt to be IND with all HEP and is able to maintain and progress current LOF IND    Time 8    Period Weeks    Status New    Target Date 05/09/21                   Plan - 03/18/21 1257     Clinical Impression Statement Pt arrives reporting increased upper trap and mid back pain starting yesterday. It was relieved by medication. She arrives to PT with no pain. Began with  gait training and Nustep for endurance. Tennis balls were added to her FWW to decrease friction and noise. Reviewed HEP and she requires min cues. Progressed with gentle core and hip strengthening. She was monitored for pain thorughout session. Also, added upper trap and levator stretches to reduce muscle tension. These were added to her HEP. Afterward she reported feeling good.    Comorbidities hx of CX, anxiety, osteopenia    PT Next Visit Plan review/ update HEP. limit BLT,  gross hip strengthening, gait training/ endurance training.    PT Home Exercise Plan [phone] - scapular retraction, chin tuck, seated ab set, sit to stand, supine clamshell              Patient will benefit from skilled therapeutic intervention in order to improve the following deficits and impairments:  Improper body mechanics, Increased muscle spasms, Decreased strength, Pain, Postural dysfunction, Decreased activity tolerance, Decreased balance, Decreased endurance, Decreased range of motion  Visit Diagnosis: Muscle weakness (generalized)  Other abnormalities of gait and mobility  Abnormal posture  Pain in thoracic spine     Problem List Patient Active Problem List   Diagnosis Date Noted   Intradural-extramedullary spinal cord neoplasms 03/08/2021    Dorene Ar, PTA 03/18/2021, 1:21 PM  [place], [place], [address] Phone: [phone]   Fax:  [phone]  Name: Xotchil Heibel MRN: [id number] Date of Birth: 06-May-1933

## 2021-03-18 NOTE — Patient Instructions (Signed)
Access Code: [email or web address] URL: [email or web address] Date: 03/18/2021 Prepared by: Hessie Diener  Exercises Seated Transversus Abdominis Bracing - 1 x daily - 7 x weekly - 2 sets - 10 reps - 5 seconds hold Seated Scapular Retraction - 1 x daily - 7 x weekly - 10 reps - 2 sets - 5 seconds hold Standing Cervical Retraction - 1 x daily - 7 x weekly - 2 sets - 10 reps - 5 second hold Sit to Stand - 2 x daily - 7 x weekly - 2 sets - 10 reps Hooklying Clamshell with Resistance - 1 x daily - 7 x weekly - 2 sets - 10 reps Seated Gentle Upper Trapezius Stretch - 1 x daily - 7 x weekly - 1 sets - 3 reps - 20-30 hold Gentle Levator Scapulae Stretch - 1 x daily - 7 x weekly - 1 sets - 3 reps - 20-30 hold

## 2021-03-20 ENCOUNTER — Encounter: Payer: Self-pay | Admitting: Physical Therapy

## 2021-03-20 ENCOUNTER — Other Ambulatory Visit: Payer: Self-pay

## 2021-03-20 ENCOUNTER — Ambulatory Visit: Payer: Medicare HMO | Admitting: Physical Therapy

## 2021-03-20 DIAGNOSIS — M542 Cervicalgia: Secondary | ICD-10-CM

## 2021-03-20 DIAGNOSIS — M546 Pain in thoracic spine: Secondary | ICD-10-CM

## 2021-03-20 DIAGNOSIS — M6281 Muscle weakness (generalized): Secondary | ICD-10-CM

## 2021-03-20 DIAGNOSIS — R2689 Other abnormalities of gait and mobility: Secondary | ICD-10-CM

## 2021-03-20 DIAGNOSIS — R293 Abnormal posture: Secondary | ICD-10-CM

## 2021-03-20 NOTE — Therapy (Signed)
[place], [place], [address] Phone: [phone]   Fax:  [phone]  Physical Therapy Treatment  Patient Details  Name: Gloria Goodman MRN: [id number] Date of Birth: 1932-11-04 Referring Provider (PT): Earnie Larsson MD   Encounter Date: 03/20/2021   PT End of Session - 03/20/21 1305     Visit Number 3    Number of Visits 17    Date for PT Re-Evaluation 05/09/21    Authorization Type A[employer] MCR: Kx Mod at 15th , FOTO 6th and 10th visit    Progress Note Due on Visit 10    PT Start Time 1235    PT Stop Time [id number]    PT Time Calculation (min) 38 min             Past Medical History:  Diagnosis Date   Cataract, nuclear sclerotic, left eye 09/12/2016   Chest tightness 03/10/2017   Diverticulosis    DJD (degenerative joint disease) of cervical spine    GAD (generalized anxiety disorder) 04/28/2018   Insomnia    Migraine    Mixed hyperlipidemia    Osteopenia    Ovarian cancer ([place]) 1997   PAT (paroxysmal atrial tachycardia) ([place]) 01/26/2017   Right upper quadrant pain    Vitreous detachment of both eyes     Past Surgical History:  Procedure Laterality Date   BREAST CYST ASPIRATION     CATARACT EXTRACTION Bilateral 2022   COLONOSCOPY  2008   ESOPHAGOGASTRODUODENOSCOPY  08/22/2016   EYE SURGERY     LAMINECTOMY N/A 03/08/2021   Procedure: Thoracic two - Thoracic four laminectomy with resection of intradural extramedullary tumor;  Surgeon: Earnie Larsson, MD;  Location: [facility];  Service: Neurosurgery;  Laterality: N/A;   TOTAL ABDOMINAL HYSTERECTOMY W/ BILATERAL SALPINGOOPHORECTOMY  1997   UPPER GASTROINTESTINAL ENDOSCOPY  08/22/2016    There were no vitals filed for this visit.   Subjective Assessment - 03/20/21 1238     Subjective Back hurt alot yesterday morning. Back hurt a little this morning but is a little better now. I went to the doctor yesteday for F/U and he doesn't want me to do any  twisting. He said I can tie my shoes. He does not want me to squeeze my shoulder blades together anymore.    Currently in Pain? Yes    Pain Score 3     Pain Location Thoracic    Pain Orientation Right;Left;Upper    Pain Descriptors / Indicators Aching    Pain Type Surgical pain    Aggravating Factors  morning times    Pain Relieving Factors moving , take tylenol                               [facility] Adult PT Treatment/Exercise - 03/20/21 0001       Ambulation/Gait   Ambulation Distance (Feet) 863 Feet    Assistive device Rolling walker    Gait Pattern Step-through pattern;Decreased stride length    Gait Comments 6 MWT 863 feet      Self-Care   Self-Care Other Self-Care Comments    Other Self-Care Comments  use of theracane to address trigger points in right upper trap/ periscap      Lumbar Exercises: Aerobic   Nustep L1 x 7 minutes UE/LE      Lumbar Exercises: Seated   Sit to Stand 10 reps    Sit to Stand Limitations 2 sets  Other Seated Lumbar Exercises chin tuck 5 sec x 10    Other Seated Lumbar Exercises upper trap and levator stretches 20 sec x 3 each      Lumbar Exercises: Supine   Clam 10 reps    Clam Limitations red band 2 sets    Bent Knee Raise 10 reps    Bent Knee Raise Limitations red band, 2 sets    Bridge 10 reps    Bridge with clamshell 10 reps   red                     PT Short Term Goals - 03/14/21 1755       PT SHORT TERM GOAL #1   Title pt to be IND with initial HEP    Time 4    Period Weeks    Status New    Target Date 04/11/21      PT SHORT TERM GOAL #2   Title pt to verbalize/ demo efficient posture and lifting mechancis to reduce and prevent back and neck pain    Time 4    Period Weeks    Status New    Target Date 04/11/21      PT SHORT TERM GOAL #3   Title pt to be able to amb for >/= 20 min with SPC or less with no report of instability or pain    Time 4    Period Weeks    Status New    Target  Date 04/11/21      PT SHORT TERM GOAL #4   Title increase 5 x STS to </= 14 sec to demo improved in mobility    Time 4    Period Weeks    Target Date 04/11/21               PT Long Term Goals - 03/14/21 1755       PT LONG TERM GOAL #1   Title increase gross bil LE strength >/= 4+/5 to promote stability with standing/ walking    Time 8    Period Weeks    Status New    Target Date 05/09/21      PT LONG TERM GOAL #2   Title increase bil UE strenght to >/= 4+/5 to assiwth with functional lifting and ADLS with on report of pain or limitations    Time 8    Period Weeks    Status New    Target Date 05/09/21      PT LONG TERM GOAL #3   Title pt to be able to stand and walk for >/=45 min with LRAD for functional endurance required for ADLS and short community amb    Time 8    Period Weeks    Status New    Target Date 05/09/21      PT LONG TERM GOAL #4   Title increase FOTO score to >/=61% limited to demo improvement in function    Time 8    Period Weeks    Status New    Target Date 05/09/21      PT LONG TERM GOAL #5   Title pt to be IND with all HEP and is able to maintain and progress current LOF IND    Time 8    Period Weeks    Status New    Target Date 05/09/21                   Plan -  03/20/21 1306     Clinical Impression Statement Pt arrives reporting increased back pain the evening of PT and the next morning. She saw MD for F/U and he instructed her to continue to avoid twisting motions or crossing arm motions. He also suggested she discontinue scap retractions for now since she was in more pain. Discussed the possibility of delayed onset muscle sorenss as a source of her discomfort. Able to continued with Nustep  and complete 6MWT. Continued with mat based LE strengthening and seated postural training and cervical stretching. She requested updated HEP.    Comorbidities hx of CX, anxiety, osteopenia    PT Treatment/Interventions ADLs/Self Care Home  Management;Electrical Stimulation;Iontophoresis '4mg'$ /ml Dexamethasone;Moist Heat;Stair training;Gait training;Functional mobility training;Therapeutic activities;Therapeutic exercise;Balance training;Neuromuscular re-education;Manual techniques;Patient/family education;Passive range of motion;Dry needling;Vasopneumatic Device    PT Next Visit Plan review/ update HEP. limit BLT,  gross hip strengthening, gait training/ endurance training.    PT Home Exercise Plan [phone] - scapular retraction (discontinue per pt), chin tuck, seated ab set, sit to stand, supine clamshell, supine march with band, supine bridge with band    Consulted and Agree with Plan of Care Patient             Patient will benefit from skilled therapeutic intervention in order to improve the following deficits and impairments:  Improper body mechanics, Increased muscle spasms, Decreased strength, Pain, Postural dysfunction, Decreased activity tolerance, Decreased balance, Decreased endurance, Decreased range of motion  Visit Diagnosis: Muscle weakness (generalized)  Other abnormalities of gait and mobility  Abnormal posture  Pain in thoracic spine  Cervicalgia     Problem List Patient Active Problem List   Diagnosis Date Noted   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 [place], [place], [address] Phone: [phone]   Fax:  [phone]  Name: Gloria Goodman MRN: [id number] Date of Birth: Aug 29, 1932

## 2021-03-27 ENCOUNTER — Ambulatory Visit: Payer: Medicare HMO | Admitting: Physical Therapy

## 2021-03-27 ENCOUNTER — Other Ambulatory Visit: Payer: Self-pay

## 2021-03-27 ENCOUNTER — Encounter: Payer: Self-pay | Admitting: Physical Therapy

## 2021-03-27 DIAGNOSIS — M546 Pain in thoracic spine: Secondary | ICD-10-CM

## 2021-03-27 DIAGNOSIS — R293 Abnormal posture: Secondary | ICD-10-CM

## 2021-03-27 DIAGNOSIS — M6281 Muscle weakness (generalized): Secondary | ICD-10-CM | POA: Diagnosis not present

## 2021-03-27 NOTE — Therapy (Signed)
[place], [place], [address] Phone: [phone]   Fax:  [phone]  Physical Therapy Treatment  Patient Details  Name: Gloria Goodman MRN: [id number] Date of Birth: [birth year] Referring Provider (PT): Earnie Larsson MD   Encounter Date: 03/27/2021   PT End of Session - 03/27/21 1551     Visit Number 4    Number of Visits 17    Date for PT Re-Evaluation 05/09/21    Authorization Type Aetna MCR: Kx Mod at 15th , FOTO 6th and 10th visit    Progress Note Due on Visit 10    PT Start Time 1548    PT Stop Time 1629    PT Time Calculation (min) 41 min    Activity Tolerance Patient tolerated treatment well             Past Medical History:  Diagnosis Date   Cataract, nuclear sclerotic, left eye 09/12/2016   Chest tightness 03/10/2017   Diverticulosis    DJD (degenerative joint disease) of cervical spine    GAD (generalized anxiety disorder) 04/28/2018   Insomnia    Migraine    Mixed hyperlipidemia    Osteopenia    Ovarian cancer ([place]) 1997   PAT (paroxysmal atrial tachycardia) ([place] [facility] [place]) 01/26/2017   Right upper quadrant pain    Vitreous detachment of both eyes     Past Surgical History:  Procedure Laterality Date   BREAST CYST ASPIRATION     CATARACT EXTRACTION Bilateral 2022   COLONOSCOPY  2008   ESOPHAGOGASTRODUODENOSCOPY  08/22/2016   EYE SURGERY     LAMINECTOMY N/A 03/08/2021   Procedure: Thoracic two - Thoracic four laminectomy with resection of intradural extramedullary tumor;  Surgeon: Earnie Larsson, MD;  Location: [facility];  Service: Neurosurgery;  Laterality: N/A;   TOTAL ABDOMINAL HYSTERECTOMY W/ BILATERAL SALPINGOOPHORECTOMY  1997   UPPER GASTROINTESTINAL ENDOSCOPY  08/22/2016    There were no vitals filed for this visit.   Subjective Assessment - 03/27/21 1551     Subjective " today my pain is about 1/10 across the top of th ehsoulders. I wanted to review some of the exercises today."     Patient Stated Goals know what to do to help herself, return to daily activities as normal as possible.    Currently in Pain? Yes    Pain Orientation Right;Left    Pain Descriptors / Indicators Aching    Pain Type Chronic pain    Pain Onset More than a month ago    Pain Frequency Intermittent    Aggravating Factors  morning time.                            [facility] Adult PT Treatment/Exercise:  Therapeutic Exercise: Upper trap / levator scapulae stretch 1 x 30 bil ea LTR 1 x 20 (cues for slow controlled motion) Clamshell 2 x 15 with GTB around knees Supine marching 2 x 10 with GTB around knees Sit to stand with RTB around knees 1 x 10  Manual Therapy:  MTPR along bil upper trap/ levator scapulae  Sub-occipital release Gentle cervical traction in supine ( being mindful of dx of oseopenia)               PT Education - 03/27/21 1604     Education Details using tennis balls to promote sub-occipital release. Reviewed HEP and discussed scapular retraction keeping the shoulder down while gently retractiong.  Person(s) Educated Patient    Methods Explanation;Verbal cues    Comprehension Verbalized understanding;Verbal cues required              PT Short Term Goals - 03/14/21 1755       PT SHORT TERM GOAL #1   Title pt to be IND with initial HEP    Time 4    Period Weeks    Status New    Target Date 04/11/21      PT SHORT TERM GOAL #2   Title pt to verbalize/ demo efficient posture and lifting mechancis to reduce and prevent back and neck pain    Time 4    Period Weeks    Status New    Target Date 04/11/21      PT SHORT TERM GOAL #3   Title pt to be able to amb for >/= 20 min with SPC or less with no report of instability or pain    Time 4    Period Weeks    Status New    Target Date 04/11/21      PT SHORT TERM GOAL #4   Title increase 5 x STS to </= 14 sec to demo improved in mobility    Time 4    Period Weeks    Target Date  04/11/21               PT Long Term Goals - 03/14/21 1755       PT LONG TERM GOAL #1   Title increase gross bil LE strength >/= 4+/5 to promote stability with standing/ walking    Time 8    Period Weeks    Status New    Target Date 05/09/21      PT LONG TERM GOAL #2   Title increase bil UE strenght to >/= 4+/5 to assiwth with functional lifting and ADLS with on report of pain or limitations    Time 8    Period Weeks    Status New    Target Date 05/09/21      PT LONG TERM GOAL #3   Title pt to be able to stand and walk for >/=45 min with LRAD for functional endurance required for ADLS and short community amb    Time 8    Period Weeks    Status New    Target Date 05/09/21      PT LONG TERM GOAL #4   Title increase FOTO score to >/=61% limited to demo improvement in function    Time 8    Period Weeks    Status New    Target Date 05/09/21      PT LONG TERM GOAL #5   Title pt to be IND with all HEP and is able to maintain and progress current LOF IND    Time 8    Period Weeks    Status New    Target Date 05/09/21                   Plan - 03/27/21 1617     Clinical Impression Statement pt arrives to session reporting consistency with her HEP continued working on hip/ knee strengthening with increased reps which she did well with. She responded well to STW along the upper trap/ levator scapulae and sub-occipitals. trialed gentle cervical distraction being mindful of osteopenia dx. being she did very well with todays session. she noted decreased pain in the neck following session    PT  Treatment/Interventions ADLs/Self Care Home Management;Electrical Stimulation;Iontophoresis '4mg'$ /ml Dexamethasone;Moist Heat;Stair training;Gait training;Functional mobility training;Therapeutic activities;Therapeutic exercise;Balance training;Neuromuscular re-education;Manual techniques;Patient/family education;Passive range of motion;Dry needling;Vasopneumatic Device    PT Next  Visit Plan review/ update HEP. limit BLT,  gross hip strengthening, gait training/ endurance training.    PT Home Exercise Plan [phone] - scapular retraction (discontinue per pt), chin tuck, seated ab set, sit to stand, supine clamshell, supine march with band, supine bridge with band    Consulted and Agree with Plan of Care Patient             Patient will benefit from skilled therapeutic intervention in order to improve the following deficits and impairments:  Improper body mechanics, Increased muscle spasms, Decreased strength, Pain, Postural dysfunction, Decreased activity tolerance, Decreased balance, Decreased endurance, Decreased range of motion  Visit Diagnosis: Muscle weakness (generalized)  Abnormal posture  Pain in thoracic spine     Problem List Patient Active Problem List   Diagnosis Date Noted   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

## 2021-03-29 ENCOUNTER — Encounter: Payer: Self-pay | Admitting: Physical Therapy

## 2021-03-29 ENCOUNTER — Other Ambulatory Visit: Payer: Self-pay

## 2021-03-29 ENCOUNTER — Ambulatory Visit: Payer: Medicare HMO | Admitting: Physical Therapy

## 2021-03-29 DIAGNOSIS — M6281 Muscle weakness (generalized): Secondary | ICD-10-CM | POA: Diagnosis not present

## 2021-03-29 DIAGNOSIS — R2689 Other abnormalities of gait and mobility: Secondary | ICD-10-CM

## 2021-03-29 DIAGNOSIS — R293 Abnormal posture: Secondary | ICD-10-CM

## 2021-03-29 DIAGNOSIS — M542 Cervicalgia: Secondary | ICD-10-CM

## 2021-03-29 DIAGNOSIS — M546 Pain in thoracic spine: Secondary | ICD-10-CM

## 2021-03-29 NOTE — Therapy (Signed)
[place], [place], [address] Phone: [phone]   Fax:  [phone]  Physical Therapy Treatment  Patient Details  Name: Gloria Goodman MRN: [id number] Date of Birth: 06/19/1933 Referring Provider (PT): Earnie Larsson MD   Encounter Date: 03/29/2021   PT End of Session - 03/29/21 1319     Visit Number 5    Number of Visits 17    Date for PT Re-Evaluation 05/09/21    Authorization Type [employer] MCR: Kx Mod at 15th , FOTO 6th and 10th visit    Progress Note Due on Visit 10    PT Start Time 1315    PT Stop Time 1359    PT Time Calculation (min) 44 min    Activity Tolerance Patient tolerated treatment well             Past Medical History:  Diagnosis Date   Cataract, nuclear sclerotic, left eye 09/12/2016   Chest tightness 03/10/2017   Diverticulosis    DJD (degenerative joint disease) of cervical spine    GAD (generalized anxiety disorder) 04/28/2018   Insomnia    Migraine    Mixed hyperlipidemia    Osteopenia    Ovarian cancer ([place]) 1997   PAT (paroxysmal atrial tachycardia) ([place]) 01/26/2017   Right upper quadrant pain    Vitreous detachment of both eyes     Past Surgical History:  Procedure Laterality Date   BREAST CYST ASPIRATION     CATARACT EXTRACTION Bilateral 2022   COLONOSCOPY  2008   ESOPHAGOGASTRODUODENOSCOPY  08/22/2016   EYE SURGERY     LAMINECTOMY N/A 03/08/2021   Procedure: Thoracic two - Thoracic four laminectomy with resection of intradural extramedullary tumor;  Surgeon: Earnie Larsson, MD;  Location: [facility];  Service: Neurosurgery;  Laterality: N/A;   TOTAL ABDOMINAL HYSTERECTOMY W/ BILATERAL SALPINGOOPHORECTOMY  1997   UPPER GASTROINTESTINAL ENDOSCOPY  08/22/2016    There were no vitals filed for this visit.   Subjective Assessment - 03/29/21 1319     Subjective " I am doing better today. I noticed I did get some spasm in the upper back but I took some muscle relaxers and it  calmedd down."    Pain Score 2     Pain Orientation Right;Left    Pain Descriptors / Indicators Aching    Pain Type Chronic pain    Pain Onset More than a month ago    Pain Frequency Intermittent                [facility] PT Assessment - 03/29/21 0001       Assessment   Medical Diagnosis Intradural-extramedullary spinal cord neoplasms                                     PT Short Term Goals - 03/14/21 1755       PT SHORT TERM GOAL #1   Title pt to be IND with initial HEP    Time 4    Period Weeks    Status New    Target Date 04/11/21      PT SHORT TERM GOAL #2   Title pt to verbalize/ demo efficient posture and lifting mechancis to reduce and prevent back and neck pain    Time 4    Period Weeks    Status New    Target Date 04/11/21      PT SHORT  TERM GOAL #3   Title pt to be able to amb for >/= 20 min with SPC or less with no report of instability or pain    Time 4    Period Weeks    Status New    Target Date 04/11/21      PT SHORT TERM GOAL #4   Title increase 5 x STS to </= 14 sec to demo improved in mobility    Time 4    Period Weeks    Target Date 04/11/21               PT Long Term Goals - 03/14/21 1755       PT LONG TERM GOAL #1   Title increase gross bil LE strength >/= 4+/5 to promote stability with standing/ walking    Time 8    Period Weeks    Status New    Target Date 05/09/21      PT LONG TERM GOAL #2   Title increase bil UE strenght to >/= 4+/5 to assiwth with functional lifting and ADLS with on report of pain or limitations    Time 8    Period Weeks    Status New    Target Date 05/09/21      PT LONG TERM GOAL #3   Title pt to be able to stand and walk for >/=45 min with LRAD for functional endurance required for ADLS and short community amb    Time 8    Period Weeks    Status New    Target Date 05/09/21      PT LONG TERM GOAL #4   Title increase FOTO score to >/=61% limited to demo improvement in  function    Time 8    Period Weeks    Status New    Target Date 05/09/21      PT LONG TERM GOAL #5   Title pt to be IND with all HEP and is able to maintain and progress current LOF IND    Time 8    Period Weeks    Status New    Target Date 05/09/21            Therapeutic Exercise: Nustep L 4 x 5 min UE/LE Upper trap / levator scapulae stretch 1 x 30 bil ea LTR 1 x 20 (cues for slow controlled motion) Standing hip abduction 2 x 15 bil standing holding onto freemotion Standing hip extension 2 x 15 bil standing holding onto freemotion Standing marching 2 x 10 alternating L/R with hands on freemotion Supine Chin tuck head lift 1 x 10 holding 5 sec   Manual Therapy: MTPR along bil upper trap/ levator scapulae  IASTM along bil upper trap/ levator and rhomboids Gentle cervical traction in supine ( being mindful of dx of oseopenia)         Plan - 03/29/21 1459     Clinical Impression Statement Gloria Goodman continues to report being compliant with her HEP. She does describe some soreness/ cramping along the shoulder blades that occured while she was sleeping at night but overall reports she is doing well. Continued working on gross hip/ knee strengthing and STW for the posterior shoulder musculature.    PT Treatment/Interventions ADLs/Self Care Home Management;Electrical Stimulation;Iontophoresis '4mg'$ /ml Dexamethasone;Moist Heat;Stair training;Gait training;Functional mobility training;Therapeutic activities;Therapeutic exercise;Balance training;Neuromuscular re-education;Manual techniques;Patient/family education;Passive range of motion;Dry needling;Vasopneumatic Device    PT Next Visit Plan review/ update HEP. limit BLT,  gross hip strengthening, gait training/ endurance training.  trial posterior shoulder strength    PT Home Exercise Plan [id number] - scapular retraction (discontinue per pt), chin tuck, seated ab set, sit to stand, supine clamshell, supine march with band, supine  bridge with band, standing hip abd/ extension, standing marching.    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 [place], [place], [address] Phone: [phone]   Fax:  [phone]  Name: Gloria Goodman MRN: [id number] Date of Birth: Feb 20, 1933

## 2021-04-01 ENCOUNTER — Ambulatory Visit: Payer: Medicare HMO | Admitting: Physical Therapy

## 2021-04-03 ENCOUNTER — Ambulatory Visit: Payer: Medicare HMO | Admitting: Physical Therapy

## 2021-04-03 ENCOUNTER — Encounter: Payer: Self-pay | Admitting: Physical Therapy

## 2021-04-03 ENCOUNTER — Other Ambulatory Visit: Payer: Self-pay

## 2021-04-03 DIAGNOSIS — M542 Cervicalgia: Secondary | ICD-10-CM

## 2021-04-03 DIAGNOSIS — M546 Pain in thoracic spine: Secondary | ICD-10-CM

## 2021-04-03 DIAGNOSIS — M6281 Muscle weakness (generalized): Secondary | ICD-10-CM

## 2021-04-03 DIAGNOSIS — R293 Abnormal posture: Secondary | ICD-10-CM

## 2021-04-03 DIAGNOSIS — R2689 Other abnormalities of gait and mobility: Secondary | ICD-10-CM

## 2021-04-03 NOTE — Therapy (Signed)
[place], [place], [address] Phone: [phone]   Fax:  [phone]  Physical Therapy Treatment  Patient Details  Name: Gloria Goodman MRN: [id number] Date of Birth: [birth year] Referring Provider (PT): Earnie Larsson MD   Encounter Date: 04/03/2021   PT End of Session - 04/03/21 1542     Visit Number 6    Number of Visits 17    Date for PT Re-Evaluation 05/09/21    Authorization Type Aet[employer] MCR: Kx Mod at 15th , FOTO 6th and 10th visit    Progress Note Due on Visit 10    PT Start Time 1542    PT Stop Time 1628    PT Time Calculation (min) 46 min    Activity Tolerance Patient tolerated treatment well    Behavior During Therapy [facility] for tasks assessed/performed             Past Medical History:  Diagnosis Date   Cataract, nuclear sclerotic, left eye 09/12/2016   Chest tightness 03/10/2017   Diverticulosis    DJD (degenerative joint disease) of cervical spine    GAD (generalized anxiety disorder) 04/28/2018   Insomnia    Migraine    Mixed hyperlipidemia    Osteopenia    Ovarian cancer ([place]) 1997   PAT (paroxysmal atrial tachycardia) ([place]) 01/26/2017   Right upper quadrant pain    Vitreous detachment of both eyes     Past Surgical History:  Procedure Laterality Date   BREAST CYST ASPIRATION     CATARACT EXTRACTION Bilateral 2022   COLONOSCOPY  2008   ESOPHAGOGASTRODUODENOSCOPY  08/22/2016   EYE SURGERY     LAMINECTOMY N/A 03/08/2021   Procedure: Thoracic two - Thoracic four laminectomy with resection of intradural extramedullary tumor;  Surgeon: Earnie Larsson, MD;  Location: [place];  Service: Neurosurgery;  Laterality: N/A;   TOTAL ABDOMINAL HYSTERECTOMY W/ BILATERAL SALPINGOOPHORECTOMY  1997   UPPER GASTROINTESTINAL ENDOSCOPY  08/22/2016    There were no vitals filed for this visit.   Subjective Assessment - 04/03/21 1542     Subjective "today I am not feeling the best I didn't sleep well.  I am just feeling blah. I was sore this morning but did take some ibuprfoen"    Patient Stated Goals know what to do to help herself, return to daily activities as normal as possible.    Currently in Pain? Yes    Pain Orientation Right;Left    Pain Descriptors / Indicators Aching    Pain Type Chronic pain    Pain Onset More than a month ago    Pain Frequency Intermittent                          [facility] Adult PT Treatment/Exercise:  Therapeutic Exercise: Nu-step L5 x 5 min UE/LE Sit to stand with RTB around the knees 2 x 15 Seated marching 2 x 15 with RTB around knees. LAQ 2 x 15 with  4# Thoracic rotation hugging black physioball 2 x 10  Thoracic extension hugging black physioball rolling it up the chest 2 x 10 Scapular retraction 1 x 10 Rows 2 x 10 with RTB  Manual Therapy:  Sub-occipital release Manual traction (gentle being mindful of osteopenia)               PT Short Term Goals - 03/14/21 1755       PT SHORT TERM GOAL #1   Title pt to be  IND with initial HEP    Time 4    Period Weeks    Status New    Target Date 04/11/21      PT SHORT TERM GOAL #2   Title pt to verbalize/ demo efficient posture and lifting mechancis to reduce and prevent back and neck pain    Time 4    Period Weeks    Status New    Target Date 04/11/21      PT SHORT TERM GOAL #3   Title pt to be able to amb for >/= 20 min with SPC or less with no report of instability or pain    Time 4    Period Weeks    Status New    Target Date 04/11/21      PT SHORT TERM GOAL #4   Title increase 5 x STS to </= 14 sec to demo improved in mobility    Time 4    Period Weeks    Target Date 04/11/21               PT Long Term Goals - 03/14/21 1755       PT LONG TERM GOAL #1   Title increase gross bil LE strength >/= 4+/5 to promote stability with standing/ walking    Time 8    Period Weeks    Status New    Target Date 05/09/21      PT LONG TERM GOAL #2   Title  increase bil UE strenght to >/= 4+/5 to assiwth with functional lifting and ADLS with on report of pain or limitations    Time 8    Period Weeks    Status New    Target Date 05/09/21      PT LONG TERM GOAL #3   Title pt to be able to stand and walk for >/=45 min with LRAD for functional endurance required for ADLS and short community amb    Time 8    Period Weeks    Status New    Target Date 05/09/21      PT LONG TERM GOAL #4   Title increase FOTO score to >/=61% limited to demo improvement in function    Time 8    Period Weeks    Status New    Target Date 05/09/21      PT LONG TERM GOAL #5   Title pt to be IND with all HEP and is able to maintain and progress current LOF IND    Time 8    Period Weeks    Status New    Target Date 05/09/21                   Plan - 04/03/21 1546     Clinical Impression Statement pt is making good progress with physical therapy. Continued working gross hip and knee strength which she did well with. began working on thoracic mobility and posterior shoulder strengthening which was added to her HEP.    PT Treatment/Interventions ADLs/Self Care Home Management;Electrical Stimulation;Iontophoresis '4mg'$ /ml Dexamethasone;Moist Heat;Stair training;Gait training;Functional mobility training;Therapeutic activities;Therapeutic exercise;Balance training;Neuromuscular re-education;Manual techniques;Patient/family education;Passive range of motion;Dry needling;Vasopneumatic Device    PT Next Visit Plan review/ update HEP. limit BLT,  gross hip strengthening, gait training/ endurance training. trial posterior shoulder strength    PT Home Exercise Plan [id number] - scapular retraction (discontinue per pt), chin tuck, seated ab set, sit to stand, supine clamshell, supine march with band, supine bridge with band, standing  hip abd/ extension, standing marching.             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 [place], [place], [address] Phone: [phone]   Fax:  [phone]  Name: Gloria Goodman MRN: [id number] Date of Birth: March 01, 1933

## 2021-04-08 ENCOUNTER — Ambulatory Visit: Payer: Medicare HMO | Admitting: Physical Therapy

## 2021-04-08 ENCOUNTER — Other Ambulatory Visit: Payer: Self-pay

## 2021-04-08 DIAGNOSIS — R293 Abnormal posture: Secondary | ICD-10-CM

## 2021-04-08 DIAGNOSIS — M6281 Muscle weakness (generalized): Secondary | ICD-10-CM

## 2021-04-08 DIAGNOSIS — R2689 Other abnormalities of gait and mobility: Secondary | ICD-10-CM

## 2021-04-08 DIAGNOSIS — M542 Cervicalgia: Secondary | ICD-10-CM

## 2021-04-08 DIAGNOSIS — M546 Pain in thoracic spine: Secondary | ICD-10-CM

## 2021-04-08 NOTE — Therapy (Signed)
[place], [place], [address] Phone: [phone]   Fax:  [phone]  Physical Therapy Treatment  Patient Details  Name: Gloria Goodman MRN: [id number] Date of Birth: [birth year] Referring Provider (PT): Earnie Larsson MD   Encounter Date: 04/08/2021   PT End of Session - 04/08/21 1335     Visit Number 7    Number of Visits 17    Date for PT Re-Evaluation 05/09/21    Authorization Type [employer] MCR: Kx Mod at 15th , FOTO 10th visit    Progress Note Due on Visit 10    PT Start Time 1331    PT Stop Time 1413    PT Time Calculation (min) 42 min    Activity Tolerance Patient tolerated treatment well    Behavior During Therapy [facility] for tasks assessed/performed             Past Medical History:  Diagnosis Date   Cataract, nuclear sclerotic, left eye 09/12/2016   Chest tightness 03/10/2017   Diverticulosis    DJD (degenerative joint disease) of cervical spine    GAD (generalized anxiety disorder) 04/28/2018   Insomnia    Migraine    Mixed hyperlipidemia    Osteopenia    Ovarian cancer ([place]) 1997   PAT (paroxysmal atrial tachycardia) ([place]) 01/26/2017   Right upper quadrant pain    Vitreous detachment of both eyes     Past Surgical History:  Procedure Laterality Date   BREAST CYST ASPIRATION     CATARACT EXTRACTION Bilateral 2022   COLONOSCOPY  2008   ESOPHAGOGASTRODUODENOSCOPY  08/22/2016   EYE SURGERY     LAMINECTOMY N/A 03/08/2021   Procedure: Thoracic two - Thoracic four laminectomy with resection of intradural extramedullary tumor;  Surgeon: Earnie Larsson, MD;  Location: [place];  Service: Neurosurgery;  Laterality: N/A;   TOTAL ABDOMINAL HYSTERECTOMY W/ BILATERAL SALPINGOOPHORECTOMY  1997   UPPER GASTROINTESTINAL ENDOSCOPY  08/22/2016    There were no vitals filed for this visit.   Subjective Assessment - 04/08/21 1335     Subjective " I notice I am still sore in neck and upper back, and along  the flank. The neck is doing better. I am a little off today I was a little dizzy"    Patient Stated Goals know what to do to help herself, return to daily activities as normal as possible.    Currently in Pain? Yes    Pain Score 1     Pain Location Thoracic    Pain Orientation Right;Left    Pain Descriptors / Indicators Aching    Pain Type Chronic pain    Pain Onset More than a month ago    Pain Frequency Intermittent    Aggravating Factors  morning                         [facility] Adult PT Treatment/Exercise:   Therapeutic Exercise: Nu-step L6 x 5 min UE/LE Rhomboid/ lat stretch with hands on freemotion leaning back 2 x 30 sec Standing functional squat 2 x 10 with HHA on freemotion Standing 1 x 20 hip abduction with bil HHA on freemotion Seated marching 2 x 15 with RTB around knees. LAQ 1 x 200 with bil 5# Rows 1 x 20 with RTB Scapular retraction with double Er 1 x 20 with RTB Seated on dyna disc 2 x 20 alternating L/R marching cues to keep core tight   Manual Therapy: Sub-occipital release  Upper trap/ levator scapulae MTPR bil Manual traction (gentle being mindful of osteopenia)                  PT Short Term Goals - 03/14/21 1755       PT SHORT TERM GOAL #1   Title pt to be IND with initial HEP    Time 4    Period Weeks    Status New    Target Date 04/11/21      PT SHORT TERM GOAL #2   Title pt to verbalize/ demo efficient posture and lifting mechancis to reduce and prevent back and neck pain    Time 4    Period Weeks    Status New    Target Date 04/11/21      PT SHORT TERM GOAL #3   Title pt to be able to amb for >/= 20 min with SPC or less with no report of instability or pain    Time 4    Period Weeks    Status New    Target Date 04/11/21      PT SHORT TERM GOAL #4   Title increase 5 x STS to </= 14 sec to demo improved in mobility    Time 4    Period Weeks    Target Date 04/11/21               PT Long Term Goals -  03/14/21 1755       PT LONG TERM GOAL #1   Title increase gross bil LE strength >/= 4+/5 to promote stability with standing/ walking    Time 8    Period Weeks    Status New    Target Date 05/09/21      PT LONG TERM GOAL #2   Title increase bil UE strenght to >/= 4+/5 to assiwth with functional lifting and ADLS with on report of pain or limitations    Time 8    Period Weeks    Status New    Target Date 05/09/21      PT LONG TERM GOAL #3   Title pt to be able to stand and walk for >/=45 min with LRAD for functional endurance required for ADLS and short community amb    Time 8    Period Weeks    Status New    Target Date 05/09/21      PT LONG TERM GOAL #4   Title increase FOTO score to >/=61% limited to demo improvement in function    Time 8    Period Weeks    Status New    Target Date 05/09/21      PT LONG TERM GOAL #5   Title pt to be IND with all HEP and is able to maintain and progress current LOF IND    Time 8    Period Weeks    Status New    Target Date 05/09/21                   Plan - 04/08/21 1414     Clinical Impression Statement pt reported feeling slightly dizzy today which prompted her to bring her RW. continued working on gross hip/ knee strengthening progressing to CKC today. She did well with posterior shoulder strengthening, and continued STW / manual traction for the neck. She did very well with session and reported no dizziness following session.    PT Treatment/Interventions ADLs/Self Care Home Management;Electrical Stimulation;Iontophoresis '4mg'$ /ml Dexamethasone;Moist Heat;Stair training;Gait  training;Functional mobility training;Therapeutic activities;Therapeutic exercise;Balance training;Neuromuscular re-education;Manual techniques;Patient/family education;Passive range of motion;Dry needling;Vasopneumatic Device    PT Next Visit Plan review/ update HEP. limit BLT,  gross hip strengthening, gait training/ endurance training. trial posterior  shoulder strength, send note to MD next session.    PT Home Exercise Plan [phone] - scapular retraction (discontinue per pt), chin tuck, seated ab set, sit to stand, supine clamshell, supine march with band, supine bridge with band, standing hip abd/ extension, standing marching.    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

## 2021-04-10 ENCOUNTER — Ambulatory Visit: Payer: Medicare HMO | Admitting: Physical Therapy

## 2021-04-19 ENCOUNTER — Other Ambulatory Visit: Payer: Self-pay

## 2021-04-19 ENCOUNTER — Encounter: Payer: Self-pay | Admitting: Physical Therapy

## 2021-04-19 ENCOUNTER — Ambulatory Visit: Payer: Medicare HMO | Attending: Neurosurgery | Admitting: Physical Therapy

## 2021-04-19 DIAGNOSIS — R2689 Other abnormalities of gait and mobility: Secondary | ICD-10-CM | POA: Insufficient documentation

## 2021-04-19 DIAGNOSIS — R293 Abnormal posture: Secondary | ICD-10-CM | POA: Insufficient documentation

## 2021-04-19 DIAGNOSIS — M546 Pain in thoracic spine: Secondary | ICD-10-CM | POA: Insufficient documentation

## 2021-04-19 DIAGNOSIS — M6281 Muscle weakness (generalized): Secondary | ICD-10-CM | POA: Diagnosis present

## 2021-04-19 DIAGNOSIS — M542 Cervicalgia: Secondary | ICD-10-CM | POA: Insufficient documentation

## 2021-04-19 NOTE — Therapy (Signed)
[place], [place], [address] Phone: [phone]   Fax:  [phone]  Physical Therapy Treatment  Patient Details  Name: Gloria Goodman MRN: [id number] Date of Birth: [birth year] Referring Provider (PT): Earnie Larsson MD   Encounter Date: 04/19/2021   PT End of Session - 04/19/21 1019     Visit Number 8    Number of Visits 17    Date for PT Re-Evaluation 05/09/21    Authorization Type Aetna MCR: Kx Mod at 15th , FOTO 10th visit    PT Start Time 1015    PT Stop Time 1058    PT Time Calculation (min) 43 min             Past Medical History:  Diagnosis Date   Cataract, nuclear sclerotic, left eye 09/12/2016   Chest tightness 03/10/2017   Diverticulosis    DJD (degenerative joint disease) of cervical spine    GAD (generalized anxiety disorder) 04/28/2018   Insomnia    Migraine    Mixed hyperlipidemia    Osteopenia    Ovarian cancer ([place]) 1997   PAT (paroxysmal atrial tachycardia) ([place]) 01/26/2017   Right upper quadrant pain    Vitreous detachment of both eyes     Past Surgical History:  Procedure Laterality Date   BREAST CYST ASPIRATION     CATARACT EXTRACTION Bilateral 2022   COLONOSCOPY  2008   ESOPHAGOGASTRODUODENOSCOPY  08/22/2016   EYE SURGERY     LAMINECTOMY N/A 03/08/2021   Procedure: Thoracic two - Thoracic four laminectomy with resection of intradural extramedullary tumor;  Surgeon: Earnie Larsson, MD;  Location: [place] [facility];  Service: Neurosurgery;  Laterality: N/A;   TOTAL ABDOMINAL HYSTERECTOMY W/ BILATERAL SALPINGOOPHORECTOMY  1997   UPPER GASTROINTESTINAL ENDOSCOPY  08/22/2016    There were no vitals filed for this visit.   Subjective Assessment - 04/19/21 1017     Subjective Pt reports she is feeling pretty good. She has shooting pain from foot to her leg for 2 nights in a row this week as well as some shoulder pain. It got better with getting up and moving around. She reports  intermittent pain across upper back/lower neck in the evenings that is associated with alot of activity.    Currently in Pain? No/denies                [facility] PT Assessment - 04/19/21 0001       Observation/Other Assessments   Focus on Therapeutic Outcomes (FOTO)  63% taken on 04/05/21      Strength   Right Hip Flexion 4+/5    Left Hip Flexion 4+/5                           [facility] Adult PT Treatment/Exercise - 04/19/21 0001       Transfers   Five time sit to stand comments  6.5 sec , then 21.sec with correct hip hinge.      Therapeutic Activites    Therapeutic Activities Other Therapeutic Activities    Other Therapeutic Activities Hip hinge for sit-stands/ squat to chair mechanics -weight of light purse used for practice- min cues      Lumbar Exercises: Aerobic   Nustep L5 x 5 minutes UE/LE      Lumbar Exercises: Seated   Sit to Stand 10 reps    Sit to Stand Limitations 2 sets    Other Seated Lumbar Exercises seated upper trunk  rotations per HEP      Lumbar Exercises: Supine   Clam 10 reps    Clam Limitations red band 2 sets    Bent Knee Raise 10 reps    Bent Knee Raise Limitations red band, 2 sets    Bridge with clamshell 10 reps   red     Knee/Hip Exercises: Standing   Other Standing Knee Exercises hp abduction 10 x 2 yellow band , hip extension 10 x 2 yellow band      Shoulder Exercises: Standing   External Rotation 10 reps    Theraband Level (Shoulder External Rotation) Level 1 (Yellow)    External Rotation Limitations bilat    Extension 15 reps    Theraband Level (Shoulder Extension) Level 2 (Red)    Row 15 reps    Theraband Level (Shoulder Row) Level 2 (Red)                       PT Short Term Goals - 04/19/21 1022       PT SHORT TERM GOAL #1   Title pt to be IND with initial HEP    Baseline Pt is independent and compliant with HEP thus far.    Time 4    Period Weeks    Status Achieved    Target Date 04/11/21       PT SHORT TERM GOAL #2   Title pt to verbalize/ demo efficient posture and lifting mechancis to reduce and prevent back and neck pain    Baseline able to return demonstrate proper hip hinge in clinic today    Time 4    Period Weeks    Status On-going    Target Date 04/11/21      PT SHORT TERM GOAL #3   Title pt to be able to amb for >/= 20 min with SPC or less with no report of instability or pain    Baseline 04/19/21: been walking in [place] up to 15 minutes without AD    Time 4    Period Weeks    Status On-going    Target Date 04/11/21      PT SHORT TERM GOAL #4   Title increase 5 x STS to </= 14 sec to demo improved in mobility    Baseline 04/11/21: 6.5 sec    Time 4    Period Weeks    Status Achieved    Target Date 04/11/21               PT Long Term Goals - 03/14/21 1755       PT LONG TERM GOAL #1   Title increase gross bil LE strength >/= 4+/5 to promote stability with standing/ walking    Time 8    Period Weeks    Status New    Target Date 05/09/21      PT LONG TERM GOAL #2   Title increase bil UE strenght to >/= 4+/5 to assiwth with functional lifting and ADLS with on report of pain or limitations    Time 8    Period Weeks    Status New    Target Date 05/09/21      PT LONG TERM GOAL #3   Title pt to be able to stand and walk for >/=45 min with LRAD for functional endurance required for ADLS and short community amb    Time 8    Period Weeks    Status New    Target  Date 05/09/21      PT LONG TERM GOAL #4   Title increase FOTO score to >/=61% limited to demo improvement in function    Time 8    Period Weeks    Status New    Target Date 05/09/21      PT LONG TERM GOAL #5   Title pt to be IND with all HEP and is able to maintain and progress current LOF IND    Time 8    Period Weeks    Status New    Target Date 05/09/21                   Plan - 04/19/21 1041     Clinical Impression Statement Pt reports missing last week due to husand  having a stroke and being hospitalized. Pt reports she feels good today. She reports some intermittent pain in right leg and right shoulder this week at night. OVerall her upper back and neck have been better with achiness happening intermittently in the evening after increased daily activity level. Today checked STGs and she has met STG# 1,#4. Worked on [employer] with lifting her purse today and she was able to demonstrate correct squat to chair level with min cues. Reviewed recent additionas to HEP and added yellow band resistance to her standing hip exercises. She noted increased effort required with right hip.    PT Treatment/Interventions ADLs/Self Care Home Management;Electrical Stimulation;Iontophoresis 30m/ml Dexamethasone;Moist Heat;Stair training;Gait training;Functional mobility training;Therapeutic activities;Therapeutic exercise;Balance training;Neuromuscular re-education;Manual techniques;Patient/family education;Passive range of motion;Dry needling;Vasopneumatic Device    PT Next Visit Plan PROGRESS NOTE- review/ update HEP. limit BLT,  gross hip strengthening, gait training/ endurance training. trial posterior shoulder strength, send note to MD next session.    PT Home Exercise Plan [id number]- scapular retraction (discontinue per pt), chin tuck, seated ab set, sit to stand, supine clamshell, supine march with band, supine bridge with band, standing hip abd/ extension, standing marching. row with red band, seated trunk rotation    Consulted and Agree with Plan of Care Patient             Patient will benefit from skilled therapeutic intervention in order to improve the following deficits and impairments:  Improper body mechanics, Increased muscle spasms, Decreased strength, Pain, Postural dysfunction, Decreased activity tolerance, Decreased balance, Decreased endurance, Decreased range of motion  Visit Diagnosis: Other abnormalities of gait and mobility  Abnormal  posture  Muscle weakness (generalized)  Pain in thoracic spine     Problem List Patient Active Problem List   Diagnosis Date Noted   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

## 2021-04-22 ENCOUNTER — Ambulatory Visit: Payer: Medicare HMO | Admitting: Physical Therapy

## 2021-04-22 ENCOUNTER — Encounter: Payer: Self-pay | Admitting: Physical Therapy

## 2021-04-22 ENCOUNTER — Other Ambulatory Visit: Payer: Self-pay

## 2021-04-22 DIAGNOSIS — R2689 Other abnormalities of gait and mobility: Secondary | ICD-10-CM | POA: Diagnosis not present

## 2021-04-22 DIAGNOSIS — M546 Pain in thoracic spine: Secondary | ICD-10-CM

## 2021-04-22 DIAGNOSIS — M542 Cervicalgia: Secondary | ICD-10-CM

## 2021-04-22 DIAGNOSIS — M6281 Muscle weakness (generalized): Secondary | ICD-10-CM

## 2021-04-22 DIAGNOSIS — R293 Abnormal posture: Secondary | ICD-10-CM

## 2021-04-22 NOTE — Therapy (Signed)
[place], [place], [address] Phone: [phone]   Fax:  [phone]  Physical Therapy Treatment Progress Note Reporting Period 03/14/2021 to 04/22/2021  See note below for Objective Data and Assessment of Progress/Goals.      Patient Details  Name: Gloria Goodman MRN: [id number] Date of Birth: December 01, 1932 Referring Provider (PT): Earnie Larsson MD   Encounter Date: 04/22/2021   PT End of Session - 04/22/21 1327     Visit Number 9    Number of Visits 17    Date for PT Re-Evaluation 05/09/21    Authorization Type Aetna MCR: Kx Mod at 15th , FOTO 10th visit    Progress Note Due on Visit 19    PT Start Time 1330    PT Stop Time 1415    PT Time Calculation (min) 45 min             Past Medical History:  Diagnosis Date   Cataract, nuclear sclerotic, left eye 09/12/2016   Chest tightness 03/10/2017   Diverticulosis    DJD (degenerative joint disease) of cervical spine    GAD (generalized anxiety disorder) 04/28/2018   Insomnia    Migraine    Mixed hyperlipidemia    Osteopenia    Ovarian cancer ([place]) 1997   PAT (paroxysmal atrial tachycardia) ([place]) 01/26/2017   Right upper quadrant pain    Vitreous detachment of both eyes     Past Surgical History:  Procedure Laterality Date   BREAST CYST ASPIRATION     CATARACT EXTRACTION Bilateral 2022   COLONOSCOPY  2008   ESOPHAGOGASTRODUODENOSCOPY  08/22/2016   EYE SURGERY     LAMINECTOMY N/A 03/08/2021   Procedure: Thoracic two - Thoracic four laminectomy with resection of intradural extramedullary tumor;  Surgeon: Earnie Larsson, MD;  Location: [facility];  Service: Neurosurgery;  Laterality: N/A;   TOTAL ABDOMINAL HYSTERECTOMY W/ BILATERAL SALPINGOOPHORECTOMY  1997   UPPER GASTROINTESTINAL ENDOSCOPY  08/22/2016    There were no vitals filed for this visit.   Subjective Assessment - 04/22/21 1334     Subjective " I've been doing pretty good, I am not  feeling the best today I am just a little shaky. My neck was alittle sore this morning but right now its not bothering me right."    Patient Stated Goals know what to do to help herself, return to daily activities as normal as possible.    Currently in Pain? No/denies   at worst 6/10   Pain Orientation Right;Left    Pain Descriptors / Indicators Aching;Sore;Tightness    Pain Onset More than a month ago    Pain Frequency Intermittent    Aggravating Factors  unsure as to what causes outside of getting out    Pain Relieving Factors moving, take tylenol                [facility] PT Assessment - 04/22/21 0001       Assessment   Medical Diagnosis Intradural-extramedullary spinal cord neoplasms    Referring Provider (PT) Earnie Larsson MD    Onset Date/Surgical Date 03/08/21      Observation/Other Assessments   Focus on Therapeutic Outcomes (FOTO)  49%      Strength   Right Shoulder Flexion 4/5    Right Shoulder Extension 4+/5    Right Shoulder ABduction 4/5    Right Shoulder Internal Rotation 4+/5    Right Shoulder External Rotation 4+/5    Left Shoulder Flexion 4/5  Left Shoulder Extension 4+/5    Left Shoulder ABduction 4/5    Left Shoulder Internal Rotation 4+/5    Left Shoulder External Rotation 4/5    Right Hip Flexion 4+/5    Right Hip Extension 4/5    Right Hip ABduction 4-/5    Right Hip ADduction 4+/5    Left Hip Flexion 4+/5    Left Hip Extension 4/5    Left Hip ABduction 4-/5    Left Hip ADduction 4+/5    Right Knee Flexion 5/5    Right Knee Extension 5/5    Left Knee Flexion 5/5    Left Knee Extension 5/5                     [facility] Adult PT Treatment/Exercise:  Therapeutic Exercise: UBE L1 x 4 min (FWD/BWD) x 2 min ea Upper trap stretch 1  x 30 sec bil Levator scapuale stretch 1 x 30 sec bil Seated chin tuck 1 x 5 holding 10 seconds Horizontal abduction 1 x 10 with RTB Rows 1 x 10 with RTB - cues to keep the shoulder from hiking   Manual  Therapy: Skilled palpation and monitoring of pt during TPDN IASTM along bil upper trap/ levator scpauale Sub-occipital release  Neuromuscular re-ed: N/A  Therapeutic Activity: N/A  Self Care N/A  ITEMS NOT PERFORMED TODAY: Bent knee raise Clamshells  Nu-step Sit to stand Hip hinge         Trigger Point Dry Needling - 04/22/21 0001     Consent Given? Yes    Education Handout Provided Yes    Muscles Treated Head and Neck Upper trapezius    Upper Trapezius Response Twitch reponse elicited;Palpable increased muscle length   bil                  PT Education - 04/22/21 1418     Education Details what TPDN is and benefits, time taken to review reassessment compared to initial evaluation findings,.    Person(s) Educated Patient    Methods Explanation;Verbal cues;Handout    Comprehension Verbalized understanding;Verbal cues required              PT Short Term Goals - 04/22/21 1340       PT SHORT TERM GOAL #1   Title pt to be IND with initial HEP    Status Achieved      PT SHORT TERM GOAL #2   Title pt to verbalize/ demo efficient posture and lifting mechancis to reduce and prevent back and neck pain    Baseline working on posture    Status Partially Met      PT SHORT TERM GOAL #3   Title pt to be able to amb for >/= 20 min with SPC or less with no report of instability or pain    Status Achieved      PT SHORT TERM GOAL #4   Title increase 5 x STS to </= 14 sec to demo improved in mobility    Status Achieved               PT Long Term Goals - 04/22/21 1345       PT LONG TERM GOAL #1   Title increase gross bil LE strength >/= 4+/5 to promote stability with standing/ walking    Period Weeks    Status On-going    Target Date 05/09/21      PT LONG TERM GOAL #2   Title increase bil  UE strenght to >/= 4+/5 to assiwth with functional lifting and ADLS with on report of pain or limitations    Period Weeks    Status On-going    Target  Date 05/09/21      PT LONG TERM GOAL #3   Title pt to be able to stand and walk for >/=45 min with LRAD for functional endurance required for ADLS and short community amb    Period Weeks    Status On-going    Target Date 05/09/21      PT LONG TERM GOAL #4   Title increase FOTO score to >/=61% limited to demo improvement in function    Status On-going    Target Date 05/09/21      PT LONG TERM GOAL #5   Title pt to be IND with all HEP and is able to maintain and progress current LOF IND    Period Weeks    Status On-going    Target Date 05/09/21                   Plan - 04/22/21 1414     Clinical Impression Statement pt arrives noting feeling shakey today but otherwise has no pain. She does continue to get pain / stiffness more in the AM. educated and consent was given for TPDN focusing on bil upper trap followed with IASTM techniqeus and stretching. She is making good progress with both UE/LE strength and is making appropriate progress toward her goals. She would benefit from continued physical therapy to decrease neck/ thoracic pain, imrpove gross UE/LE strength, and maximize her function by addressing the deficits listed.    PT Treatment/Interventions ADLs/Self Care Home Management;Electrical Stimulation;Iontophoresis 27m/ml Dexamethasone;Moist Heat;Stair training;Gait training;Functional mobility training;Therapeutic activities;Therapeutic exercise;Balance training;Neuromuscular re-education;Manual techniques;Patient/family education;Passive range of motion;Dry needling;Vasopneumatic Device    PT Next Visit Plan response to DN review/ update HEP. limit BLT,  gross hip strengthening, gait training/ endurance training. trial posterior shoulder strength,    PT Home Exercise Plan [id number]- scapular retraction (discontinue per pt), chin tuck, seated ab set, sit to stand, supine clamshell, supine march with band, supine bridge with band, standing hip abd/ extension, standing marching.  row with red band, seated trunk rotation             Patient will benefit from skilled therapeutic intervention in order to improve the following deficits and impairments:  Improper body mechanics, Increased muscle spasms, Decreased strength, Pain, Postural dysfunction, Decreased activity tolerance, Decreased balance, Decreased endurance, Decreased range of motion  Visit Diagnosis: Other abnormalities of gait and mobility  Abnormal posture  Muscle weakness (generalized)  Pain in thoracic spine  Cervicalgia     Problem List Patient Active Problem List   Diagnosis Date Noted   Intradural-extramedullary spinal cord neoplasms 03/08/2021   KStarr LakePT, DPT, LAT, ATC  04/22/21  2:20 PM      [facility] [address]Phone: [phone]  Fax:  [phone] Name: Gloria NicollsMRN: [id number] of Birth: [birth year]

## 2021-04-24 ENCOUNTER — Encounter: Payer: Self-pay | Admitting: Physical Therapy

## 2021-04-24 ENCOUNTER — Ambulatory Visit: Payer: Medicare HMO | Admitting: Physical Therapy

## 2021-04-24 ENCOUNTER — Other Ambulatory Visit: Payer: Self-pay

## 2021-04-24 DIAGNOSIS — M542 Cervicalgia: Secondary | ICD-10-CM

## 2021-04-24 DIAGNOSIS — R293 Abnormal posture: Secondary | ICD-10-CM

## 2021-04-24 DIAGNOSIS — M546 Pain in thoracic spine: Secondary | ICD-10-CM

## 2021-04-24 DIAGNOSIS — M6281 Muscle weakness (generalized): Secondary | ICD-10-CM

## 2021-04-24 DIAGNOSIS — R2689 Other abnormalities of gait and mobility: Secondary | ICD-10-CM

## 2021-04-24 NOTE — Therapy (Signed)
[place], [place], [address] Phone: [phone]   Fax:  [phone]  Physical Therapy Treatment  Patient Details  Name: Gloria Goodman MRN: [id number] Date of Birth: 1932-12-17 Referring Provider (PT): Earnie Larsson MD   Encounter Date: 04/24/2021   PT End of Session - 04/24/21 1154     Visit Number 10    Number of Visits 17    Date for PT Re-Evaluation 05/09/21    Authorization Type Aetna [id number] note (done on 9th visit)  Kx Mod at 15th , FOTO 10th visit    PT Start Time 1150    PT Stop Time 1230    PT Time Calculation (min) 40 min             Past Medical History:  Diagnosis Date   Cataract, nuclear sclerotic, left eye 09/12/2016   Chest tightness 03/10/2017   Diverticulosis    DJD (degenerative joint disease) of cervical spine    GAD (generalized anxiety disorder) 04/28/2018   Insomnia    Migraine    Mixed hyperlipidemia    Osteopenia    Ovarian cancer ([place]) 1997   PAT (paroxysmal atrial tachycardia) ([place]) 01/26/2017   Right upper quadrant pain    Vitreous detachment of both eyes     Past Surgical History:  Procedure Laterality Date   BREAST CYST ASPIRATION     CATARACT EXTRACTION Bilateral 2022   COLONOSCOPY  2008   ESOPHAGOGASTRODUODENOSCOPY  08/22/2016   EYE SURGERY     LAMINECTOMY N/A 03/08/2021   Procedure: Thoracic two - Thoracic four laminectomy with resection of intradural extramedullary tumor;  Surgeon: Earnie Larsson, MD;  Location: [facility];  Service: Neurosurgery;  Laterality: N/A;   TOTAL ABDOMINAL HYSTERECTOMY W/ BILATERAL SALPINGOOPHORECTOMY  1997   UPPER GASTROINTESTINAL ENDOSCOPY  08/22/2016    There were no vitals filed for this visit.   Subjective Assessment - 04/24/21 1152     Subjective Legs were tingling before I went to bed and when I woke up my right arm and shoulder were hurting. I felt alot better in my neck after the dry needling. I  have slight pain in  the upper back today.    Currently in Pain? Yes    Pain Score 0-No pain    Pain Location Thoracic    Pain Orientation Right;Left    Pain Descriptors / Indicators Aching;Tightness;Sore    Pain Type Chronic pain                               [facility] Adult PT Treatment/Exercise - 04/24/21 0001       Lumbar Exercises: Seated   Sit to Stand 10 reps   cues for posture- head positon/chin tuck   Sit to Stand Limitations 2 sets    Other Seated Lumbar Exercises upper trap and levator stretches 20 sec x 3 each      Lumbar Exercises: Supine   Clam 20 reps    Clam Limitations green band    Bridge 20 reps      Shoulder Exercises: Standing   Horizontal ABduction 15 reps    Theraband Level (Shoulder Horizontal ABduction) Level 1 (Yellow)    Horizontal ABduction Limitations standing at wall- towel roll at neck    External Rotation 20 reps    Theraband Level (Shoulder External Rotation) Level 1 (Yellow)    External Rotation Limitations bilat, standing at wal, towel roll at neck  Extension 20 reps    Theraband Level (Shoulder Extension) Level 2 (Red)    Row 20 reps    Theraband Level (Shoulder Row) Level 2 (Red)    Other Standing Exercises chint tuck at wall with towel roll                       PT Short Term Goals - 04/22/21 1340       PT SHORT TERM GOAL #1   Title pt to be IND with initial HEP    Status Achieved      PT SHORT TERM GOAL #2   Title pt to verbalize/ demo efficient posture and lifting mechancis to reduce and prevent back and neck pain    Baseline working on posture    Status Partially Met      PT SHORT TERM GOAL #3   Title pt to be able to amb for >/= 20 min with SPC or less with no report of instability or pain    Status Achieved      PT SHORT TERM GOAL #4   Title increase 5 x STS to </= 14 sec to demo improved in mobility    Status Achieved               PT Long Term Goals - 04/22/21 1345       PT LONG TERM GOAL #1    Title increase gross bil LE strength >/= 4+/5 to promote stability with standing/ walking    Period Weeks    Status On-going    Target Date 05/09/21      PT LONG TERM GOAL #2   Title increase bil UE strenght to >/= 4+/5 to assiwth with functional lifting and ADLS with on report of pain or limitations    Period Weeks    Status On-going    Target Date 05/09/21      PT LONG TERM GOAL #3   Title pt to be able to stand and walk for >/=45 min with LRAD for functional endurance required for ADLS and short community amb    Period Weeks    Status On-going    Target Date 05/09/21      PT LONG TERM GOAL #4   Title increase FOTO score to >/=61% limited to demo improvement in function    Status On-going    Target Date 05/09/21      PT LONG TERM GOAL #5   Title pt to be IND with all HEP and is able to maintain and progress current LOF IND    Period Weeks    Status On-going    Target Date 05/09/21                   Plan - 04/24/21 1359     Clinical Impression Statement Pt reports TPDN was helpful for her neck. She had min tightness and doscomfort in the morming along upper back but is currently without pain. Continued with scapular stabilization with good tolerance to standing bands with cues for posture. Worked on posture with standing bands and wall for postural feedback. Towel roll needed at neck due to forward head. Also worked on squatting to chair with cues for postural alignement.  Pt tolerated the session well withoutt increased pain. She was encouraged to continue with current HEP.    PT Treatment/Interventions ADLs/Self Care Home Management;Electrical Stimulation;Iontophoresis 64m/ml Dexamethasone;Moist Heat;Stair training;Gait training;Functional mobility training;Therapeutic activities;Therapeutic exercise;Balance training;Neuromuscular re-education;Manual techniques;Patient/family education;Passive range of motion;Dry  needling;Vasopneumatic Device    PT Next Visit Plan  Capture FOTO status; response to DN review/ update HEP. limit BLT,  gross hip strengthening, gait training/ endurance training. trial posterior shoulder strength,    PT Home Exercise Plan [id number] - scapular retraction (discontinue per pt), chin tuck, seated ab set, sit to stand, supine clamshell, supine march with band, supine bridge with band, standing hip abd/ extension, standing marching. row with red band, seated trunk rotation             Patient will benefit from skilled therapeutic intervention in order to improve the following deficits and impairments:  Improper body mechanics, Increased muscle spasms, Decreased strength, Pain, Postural dysfunction, Decreased activity tolerance, Decreased balance, Decreased endurance, Decreased range of motion  Visit Diagnosis: Other abnormalities of gait and mobility  Abnormal posture  Muscle weakness (generalized)  Pain in thoracic spine  Cervicalgia     Problem List Patient Active Problem List   Diagnosis Date Noted   Intradural-extramedullary spinal cord neoplasms 03/08/2021    Dorene Ar, PTA 04/24/2021, 2:05 PM  [place], [place], [address] Phone: [phone]   Fax:  [phone]  Name: Antanisha Mohs MRN: [id number] Date of Birth: [birth year]

## 2021-04-29 ENCOUNTER — Other Ambulatory Visit: Payer: Self-pay

## 2021-04-29 ENCOUNTER — Encounter: Payer: Self-pay | Admitting: Physical Therapy

## 2021-04-29 ENCOUNTER — Ambulatory Visit: Payer: Medicare HMO | Admitting: Physical Therapy

## 2021-04-29 DIAGNOSIS — R293 Abnormal posture: Secondary | ICD-10-CM

## 2021-04-29 DIAGNOSIS — M542 Cervicalgia: Secondary | ICD-10-CM

## 2021-04-29 DIAGNOSIS — M6281 Muscle weakness (generalized): Secondary | ICD-10-CM

## 2021-04-29 DIAGNOSIS — R2689 Other abnormalities of gait and mobility: Secondary | ICD-10-CM | POA: Diagnosis not present

## 2021-04-29 DIAGNOSIS — M546 Pain in thoracic spine: Secondary | ICD-10-CM

## 2021-04-29 NOTE — Therapy (Signed)
[place], [place], [address] Phone: [phone]   Fax:  [phone]  Physical Therapy Treatment  Patient Details  Name: Gloria Goodman MRN: [id number] Date of Birth: February 14, 1933 Referring Provider (PT): Earnie Larsson MD   Encounter Date: 04/29/2021   PT End of Session - 04/29/21 1333     Visit Number 11    Number of Visits 17    Date for PT Re-Evaluation 05/09/21    Authorization Type Aetna [id number] note (done on 9th visit)  Kx Mod at 15th , FOTO 10th visit    Progress Note Due on Visit 19    PT Start Time 1330    PT Stop Time 1413    PT Time Calculation (min) 43 min    Activity Tolerance Patient tolerated treatment well    Behavior During Therapy [facility] for tasks assessed/performed             Past Medical History:  Diagnosis Date   Cataract, nuclear sclerotic, left eye 09/12/2016   Chest tightness 03/10/2017   Diverticulosis    DJD (degenerative joint disease) of cervical spine    GAD (generalized anxiety disorder) 04/28/2018   Insomnia    Migraine    Mixed hyperlipidemia    Osteopenia    Ovarian cancer ([place]) 1997   PAT (paroxysmal atrial tachycardia) ([place]) 01/26/2017   Right upper quadrant pain    Vitreous detachment of both eyes     Past Surgical History:  Procedure Laterality Date   BREAST CYST ASPIRATION     CATARACT EXTRACTION Bilateral 2022   COLONOSCOPY  2008   ESOPHAGOGASTRODUODENOSCOPY  08/22/2016   EYE SURGERY     LAMINECTOMY N/A 03/08/2021   Procedure: Thoracic two - Thoracic four laminectomy with resection of intradural extramedullary tumor;  Surgeon: Earnie Larsson, MD;  Location: [facility];  Service: Neurosurgery;  Laterality: N/A;   TOTAL ABDOMINAL HYSTERECTOMY W/ BILATERAL SALPINGOOPHORECTOMY  1997   UPPER GASTROINTESTINAL ENDOSCOPY  08/22/2016    There were no vitals filed for this visit.   Subjective Assessment - 04/29/21 1335     Subjective " I still get some odd  feeling, nothing in particular. I think my anxiety is playing a role. I do get some soreness in the shoulder which seems to occur at night mostly. The feet I've had some mostly in the L foot/ heel that goes up into the leg, it doesn't seem to last. The neck is doing pretty well, and it really hasn't hurt since the DN."    Patient Stated Goals know what to do to help herself, return to daily activities as normal as possible.    Currently in Pain? Yes    Pain Score 0-No pain    Pain Location Thoracic    Pain Orientation Right    Pain Descriptors / Indicators Aching;Tightness    Pain Type Chronic pain    Pain Onset More than a month ago    Pain Frequency Intermittent    Aggravating Factors  unsure to the cause    Pain Relieving Factors moving, take tylenol.                      [facility] Adult PT Treatment/Exercise:  Therapeutic Exercise: Nu-Step L7 x 6 min UE/LE R upper trap / levator scapulae stretch 1 x 30 sec ea. Horizontal abduction 1 x 12 RTB Diagonal horizontal abduction 1 x 10 bil RTB Standing marching 1 x 10 with RTB around feet with  bil HHA from counter for balance Standing hip abduction 2 x 10 with RTB around ankles  Manual Therapy: Skilled palpation and monitoring of pt during TPDN IASTM along the R upper trap/ levator scapulae C3 - C7 PA grade III  Neuromuscular re-ed: Rhomberg balance 1 x 30 EC, 1 x 30 EO Tandem 1 x 30 sec EO, & EC, then switching lead leg Mod postural sway noted with tandem and Eyes closed  Therapeutic Activity: N/A  Self Care: Reviewed positive mindset and thinking regarding pain. And that she is doing more therefore some soreness is normal  ITEMS NOT PERFORMED TODAY: Therapeutic Exercise: NA  Manual Therapy:  NA  Neuromuscular re-ed: NA  Therapeutic Activity: NA            Trigger Point Dry Needling - 04/29/21 0001     Consent Given? Yes    Education Handout Provided Yes    Muscles Treated Head and Neck Upper  trapezius;Levator scapulae    Upper Trapezius Response Twitch reponse elicited;Palpable increased muscle length   R only   Levator Scapulae Response Twitch response elicited;Palpable increased muscle length   R only                  PT Education - 04/29/21 1358     Education Details Reviewed HEP and benefits of strengthening to promote stbaility    Person(s) Educated Patient    Methods Explanation;Verbal cues    Comprehension Verbalized understanding;Verbal cues required              PT Short Term Goals - 04/22/21 1340       PT SHORT TERM GOAL #1   Title pt to be IND with initial HEP    Status Achieved      PT SHORT TERM GOAL #2   Title pt to verbalize/ demo efficient posture and lifting mechancis to reduce and prevent back and neck pain    Baseline working on posture    Status Partially Met      PT SHORT TERM GOAL #3   Title pt to be able to amb for >/= 20 min with SPC or less with no report of instability or pain    Status Achieved      PT SHORT TERM GOAL #4   Title increase 5 x STS to </= 14 sec to demo improved in mobility    Status Achieved               PT Long Term Goals - 04/22/21 1345       PT LONG TERM GOAL #1   Title increase gross bil LE strength >/= 4+/5 to promote stability with standing/ walking    Period Weeks    Status On-going    Target Date 05/09/21      PT LONG TERM GOAL #2   Title increase bil UE strenght to >/= 4+/5 to assiwth with functional lifting and ADLS with on report of pain or limitations    Period Weeks    Status On-going    Target Date 05/09/21      PT LONG TERM GOAL #3   Title pt to be able to stand and walk for >/=45 min with LRAD for functional endurance required for ADLS and short community amb    Period Weeks    Status On-going    Target Date 05/09/21      PT LONG TERM GOAL #4   Title increase FOTO score to >/=61% limited to demo improvement  in function    Status On-going    Target Date 05/09/21       PT LONG TERM GOAL #5   Title pt to be IND with all HEP and is able to maintain and progress current LOF IND    Period Weeks    Status On-going    Target Date 05/09/21                   Plan - 04/29/21 1405     Clinical Impression Statement pt arrives noting improvement in pain since the last session. continued PTDN focusing on the R upper trap/ levator scapulae followed with IASTM techniques. continued working on posterior shoulder strengthening which she continues to do well with. trialed balacen with narrow BOS with both eyes open/ closed which she did well with mod postural sway noted with eyes closed.    PT Treatment/Interventions ADLs/Self Care Home Management;Electrical Stimulation;Iontophoresis 31m/ml Dexamethasone;Moist Heat;Stair training;Gait training;Functional mobility training;Therapeutic activities;Therapeutic exercise;Balance training;Neuromuscular re-education;Manual techniques;Patient/family education;Passive range of motion;Dry needling;Vasopneumatic Device    PT Next Visit Plan FOTO status,  response to DN review/ update HEP.   gross hip strengthening, gait training/ endurance training. trial posterior shoulder strength, update for balance    PT Home Exercise Plan [id number]- scapular retraction (discontinue per pt), chin tuck, seated ab set, sit to stand, supine clamshell, supine march with band, supine bridge with band, standing hip abd/ extension, standing marching. row with red band, seated trunk rotation    Consulted and Agree with Plan of Care Patient             Patient will benefit from skilled therapeutic intervention in order to improve the following deficits and impairments:  Improper body mechanics, Increased muscle spasms, Decreased strength, Pain, Postural dysfunction, Decreased activity tolerance, Decreased balance, Decreased endurance, Decreased range of motion  Visit Diagnosis: Other abnormalities of gait and mobility  Abnormal posture  Muscle  weakness (generalized)  Pain in thoracic spine  Cervicalgia     Problem List Patient Active Problem List   Diagnosis Date Noted   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

## 2021-05-01 ENCOUNTER — Encounter: Payer: Self-pay | Admitting: Physical Therapy

## 2021-05-01 ENCOUNTER — Other Ambulatory Visit: Payer: Self-pay

## 2021-05-01 ENCOUNTER — Ambulatory Visit: Payer: Medicare HMO | Admitting: Physical Therapy

## 2021-05-01 DIAGNOSIS — M542 Cervicalgia: Secondary | ICD-10-CM

## 2021-05-01 DIAGNOSIS — M6281 Muscle weakness (generalized): Secondary | ICD-10-CM

## 2021-05-01 DIAGNOSIS — M546 Pain in thoracic spine: Secondary | ICD-10-CM

## 2021-05-01 DIAGNOSIS — R2689 Other abnormalities of gait and mobility: Secondary | ICD-10-CM

## 2021-05-01 DIAGNOSIS — R293 Abnormal posture: Secondary | ICD-10-CM

## 2021-05-01 NOTE — Therapy (Signed)
[place], [place], [address] Phone: [phone]   Fax:  [phone]  Physical Therapy Treatment  Patient Details  Name: Gloria Goodman MRN: [id number] Date of Birth: Apr 25, 1933 Referring Provider (PT): Earnie Larsson MD   Encounter Date: 05/01/2021   PT End of Session - 05/01/21 1457     Visit Number 12    Number of Visits 17    Date for PT Re-Evaluation 05/09/21    Authorization Type Aetna [id number] note (done on 9th visit)  Kx Mod at 15th , FOTO 10th visit    Progress Note Due on Visit 19    PT Start Time 1457    Activity Tolerance Patient tolerated treatment well    Behavior During Therapy [facility] for tasks assessed/performed             Past Medical History:  Diagnosis Date   Cataract, nuclear sclerotic, left eye 09/12/2016   Chest tightness 03/10/2017   Diverticulosis    DJD (degenerative joint disease) of cervical spine    GAD (generalized anxiety disorder) 04/28/2018   Insomnia    Migraine    Mixed hyperlipidemia    Osteopenia    Ovarian cancer ([place]) 1997   PAT (paroxysmal atrial tachycardia) ([place]) 01/26/2017   Right upper quadrant pain    Vitreous detachment of both eyes     Past Surgical History:  Procedure Laterality Date   BREAST CYST ASPIRATION     CATARACT EXTRACTION Bilateral 2022   COLONOSCOPY  2008   ESOPHAGOGASTRODUODENOSCOPY  08/22/2016   EYE SURGERY     LAMINECTOMY N/A 03/08/2021   Procedure: Thoracic two - Thoracic four laminectomy with resection of intradural extramedullary tumor;  Surgeon: Earnie Larsson, MD;  Location: [place];  Service: Neurosurgery;  Laterality: N/A;   TOTAL ABDOMINAL HYSTERECTOMY W/ BILATERAL SALPINGOOPHORECTOMY  1997   UPPER GASTROINTESTINAL ENDOSCOPY  08/22/2016    There were no vitals filed for this visit.   Subjective Assessment - 05/01/21 1500     Subjective "I am doing pretty well. I am having 2 bad nights regarding my L foot only. My  shoulder hasn't been bother me but mostly like it is falling asleep."    Patient Stated Goals know what to do to help herself, return to daily activities as normal as possible.    Currently in Pain? No/denies    Pain Onset More than a month ago                         [facility] Adult PT Treatment/Exercise:  Therapeutic Exercise: Nu-Step L7 x 5 min UE/LE Upper trap stretch R 1 x 30 sec Calf stretch 2 x 30 sec slant board Standing marching at free motion 1 x 20 Standing hip abduction 2 x 12 with RTB Standing hip ext 2 x 12 with RTB Rows 2 x 12 with GTB Shoulder extension 2 x 12 with RTB Double ER with scapular retraction 2 x 12 with GTB  Manual Therapy:  N/A  Neuromuscular re-ed: Balance in the corner Rhomberg balance 1 x 30 EO, progressed 2 x 30 sec with EC Modified tandem 2 x 30 before switching lead leg and repeating, 1 x 30 ea position with head turns.    Therapeutic Activity: N/A  Self Care: N/A  ITEMS NOT PERFORMED TODAY:                PT Education - 05/01/21 1522     Education Details  Reviewed HEP and updated today for standing calf stretch and corner balance    Person(s) Educated Patient    Methods Explanation;Verbal cues;Handout    Comprehension Verbalized understanding;Verbal cues required              PT Short Term Goals - 04/22/21 1340       PT SHORT TERM GOAL #1   Title pt to be IND with initial HEP    Status Achieved      PT SHORT TERM GOAL #2   Title pt to verbalize/ demo efficient posture and lifting mechancis to reduce and prevent back and neck pain    Baseline working on posture    Status Partially Met      PT SHORT TERM GOAL #3   Title pt to be able to amb for >/= 20 min with SPC or less with no report of instability or pain    Status Achieved      PT SHORT TERM GOAL #4   Title increase 5 x STS to </= 14 sec to demo improved in mobility    Status Achieved               PT Long Term Goals - 04/22/21  1345       PT LONG TERM GOAL #1   Title increase gross bil LE strength >/= 4+/5 to promote stability with standing/ walking    Period Weeks    Status On-going    Target Date 05/09/21      PT LONG TERM GOAL #2   Title increase bil UE strenght to >/= 4+/5 to assiwth with functional lifting and ADLS with on report of pain or limitations    Period Weeks    Status On-going    Target Date 05/09/21      PT LONG TERM GOAL #3   Title pt to be able to stand and walk for >/=45 min with LRAD for functional endurance required for ADLS and short community amb    Period Weeks    Status On-going    Target Date 05/09/21      PT LONG TERM GOAL #4   Title increase FOTO score to >/=61% limited to demo improvement in function    Status On-going    Target Date 05/09/21      PT LONG TERM GOAL #5   Title pt to be IND with all HEP and is able to maintain and progress current LOF IND    Period Weeks    Status On-going    Target Date 05/09/21                   Plan - 05/01/21 1511     Clinical Impression Statement Mrs Common reports no pain today but does note some N/T in the R shoulder at night and 2 nights of L heel pain that occurs only when she is laying in bed. provided some calf stretching to help potentially address the heel pain at night. continued working on bil LE and bil UE strengthening. progressed with balance training in corner for safety which was given as an HEP today. plan to reassess next session, but plan is to drop down to 1 x a week for th enext 3-4 weeks to promote strengthening, update HEP and anticipate d/C at that time.    PT Treatment/Interventions ADLs/Self Care Home Management;Electrical Stimulation;Iontophoresis 52m/ml Dexamethasone;Moist Heat;Stair training;Gait training;Functional mobility training;Therapeutic activities;Therapeutic exercise;Balance training;Neuromuscular re-education;Manual techniques;Patient/family education;Passive range of motion;Dry  needling;Vasopneumatic Device  PT Next Visit Plan ERO , update HEP.   gross hip strengthening, gait training/ endurance training. trial posterior shoulder strength, update for balance    PT Home Exercise Plan [id number] - scapular retraction (discontinue per pt), chin tuck, seated ab set, sit to stand, supine clamshell, supine march with band, supine bridge with band, standing hip abd/ extension, standing marching. row with red band, seated trunk rotation, calf stretch standing, corner balance rhomberg/ tandem    Consulted and Agree with Plan of Care Patient             Patient will benefit from skilled therapeutic intervention in order to improve the following deficits and impairments:  Improper body mechanics, Increased muscle spasms, Decreased strength, Pain, Postural dysfunction, Decreased activity tolerance, Decreased balance, Decreased endurance, Decreased range of motion  Visit Diagnosis: Other abnormalities of gait and mobility  Abnormal posture  Muscle weakness (generalized)  Pain in thoracic spine  Cervicalgia     Problem List Patient Active Problem List   Diagnosis Date Noted   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

## 2021-05-06 ENCOUNTER — Other Ambulatory Visit: Payer: Self-pay

## 2021-05-06 ENCOUNTER — Encounter: Payer: Self-pay | Admitting: Physical Therapy

## 2021-05-06 ENCOUNTER — Ambulatory Visit: Payer: Medicare HMO | Admitting: Physical Therapy

## 2021-05-06 DIAGNOSIS — M6281 Muscle weakness (generalized): Secondary | ICD-10-CM

## 2021-05-06 DIAGNOSIS — R2689 Other abnormalities of gait and mobility: Secondary | ICD-10-CM | POA: Diagnosis not present

## 2021-05-06 DIAGNOSIS — R293 Abnormal posture: Secondary | ICD-10-CM

## 2021-05-06 DIAGNOSIS — M546 Pain in thoracic spine: Secondary | ICD-10-CM

## 2021-05-06 DIAGNOSIS — M542 Cervicalgia: Secondary | ICD-10-CM

## 2021-05-06 NOTE — Therapy (Signed)
[place], [place], [address] Phone: [phone]   Fax:  [phone]  Physical Therapy Treatment / Re-certification  Patient Details  Name: Gloria Goodman MRN: [id number] Date of Birth: 1933/02/21 Referring Provider (PT): Earnie Larsson MD   Encounter Date: 05/06/2021   PT End of Session - 05/06/21 1337     Visit Number 13    Number of Visits 17    Date for PT Re-Evaluation 06/03/21    Authorization Type Aetna [id number] note (done on 9th visit)  Kx Mod at 15th , FOTO 10th visit    Progress Note Due on Visit 19    PT Start Time 1330    PT Stop Time 1414    PT Time Calculation (min) 44 min    Activity Tolerance Patient tolerated treatment well    Behavior During Therapy [facility] for tasks assessed/performed             Past Medical History:  Diagnosis Date   Cataract, nuclear sclerotic, left eye 09/12/2016   Chest tightness 03/10/2017   Diverticulosis    DJD (degenerative joint disease) of cervical spine    GAD (generalized anxiety disorder) 04/28/2018   Insomnia    Migraine    Mixed hyperlipidemia    Osteopenia    Ovarian cancer ([place]) 1997   PAT (paroxysmal atrial tachycardia) ([place]) 01/26/2017   Right upper quadrant pain    Vitreous detachment of both eyes     Past Surgical History:  Procedure Laterality Date   BREAST CYST ASPIRATION     CATARACT EXTRACTION Bilateral 2022   COLONOSCOPY  2008   ESOPHAGOGASTRODUODENOSCOPY  08/22/2016   EYE SURGERY     LAMINECTOMY N/A 03/08/2021   Procedure: Thoracic two - Thoracic four laminectomy with resection of intradural extramedullary tumor;  Surgeon: Earnie Larsson, MD;  Location: [place];  Service: Neurosurgery;  Laterality: N/A;   TOTAL ABDOMINAL HYSTERECTOMY W/ BILATERAL SALPINGOOPHORECTOMY  1997   UPPER GASTROINTESTINAL ENDOSCOPY  08/22/2016    There were no vitals filed for this visit.   Subjective Assessment - 05/06/21 1337     Subjective " I am  doing well physically but just haven't been doing well thinking my anxiety and it seems to be a problem. I have been having having more a fullness in my ears which I am not sure what is causing in."    Patient Stated Goals know what to do to help herself, return to daily activities as normal as possible.    Currently in Pain? No/denies    Pain Score 0-No pain    Pain Orientation Right    Pain Descriptors / Indicators Aching    Pain Type Chronic pain    Pain Onset More than a month ago                [facility] PT Assessment - 05/06/21 0001       Assessment   Medical Diagnosis Intradural-extramedullary spinal cord neoplasms    Referring Provider (PT) Earnie Larsson MD      Strength   Right Shoulder Flexion 4/5    Right Shoulder Extension 4+/5    Right Shoulder ABduction 4/5    Right Shoulder Internal Rotation 4+/5    Right Shoulder External Rotation 4+/5    Left Shoulder Flexion 4/5    Left Shoulder Extension 4+/5    Left Shoulder ABduction 4/5    Left Shoulder Internal Rotation 4+/5    Left Shoulder External Rotation 4+/5  Right Hip Flexion 4+/5    Right Hip Extension 4/5    Right Hip ABduction 4/5    Right Hip ADduction 4+/5    Left Hip Flexion 4+/5    Left Hip Extension 4/5    Left Hip ABduction 4/5    Left Hip ADduction 4+/5                      [facility] Adult PT Treatment/Exercise:  Therapeutic Exercise: NU-STep L5 x 5 min UE/LE Upper trap/ levator scapulae stretch 1 x 30 bil Horizontal abduction seated 2 x 10 with GTB Scapular retraction with double ER 2 x 10 with GTB Sit to stand 2 x 10 holding 10# Kettlebell  Manual Therapy: Sub-occipital release Bil upper trap / levator scapulae MTPR Gentle cervical traction  Neuromuscular re-ed: N/A  Therapeutic Activity: N/A  Self Care: N/A  ITEMS NOT PERFORMED TODAY:               PT Education - 05/06/21 1351     Education Details discussed benefits of following up with her MD regarding  recent onset of anxiety. Reviewed HEP and updated today.    Person(s) Educated Patient    Methods Explanation;Verbal cues    Comprehension Verbalized understanding;Verbal cues required              PT Short Term Goals - 05/06/21 1415       PT SHORT TERM GOAL #1   Title pt to be IND with initial HEP    Status Achieved      PT SHORT TERM GOAL #2   Title pt to verbalize/ demo efficient posture and lifting mechancis to reduce and prevent back and neck pain    Status Achieved      PT SHORT TERM GOAL #3   Title pt to be able to amb for >/= 20 min with SPC or less with no report of instability or pain    Status Achieved      PT SHORT TERM GOAL #4   Title increase 5 x STS to </= 14 sec to demo improved in mobility    Status Achieved               PT Long Term Goals - 05/06/21 1414       PT LONG TERM GOAL #1   Title increase gross bil LE strength >/= 4+/5 to promote stability with standing/ walking    Status Partially Met    Target Date 06/03/21      PT LONG TERM GOAL #2   Title increase bil UE strenght to >/= 4+/5 to assiwth with functional lifting and ADLS with on report of pain or limitations    Period Weeks    Status Partially Met    Target Date 06/03/21      PT LONG TERM GOAL #3   Title pt to be able to stand and walk for >/=45 min with LRAD for functional endurance required for ADLS and short community amb    Status Partially Met      PT LONG TERM GOAL #4   Title increase FOTO score to >/=61% limited to demo improvement in function    Status Unable to assess    Target Date 06/03/21      PT LONG TERM GOAL #5   Title pt to be IND with all HEP and is able to maintain and progress current LOF IND    Period Weeks    Status On-going  Target Date 06/03/21                   Plan - 05/06/21 1359     Clinical Impression Statement pt arrives to session reporting she feels good physically but has been having more issues with anxiety. Continued working  on shoulder strengthe and hip/ glute strengthening which she continues to do well with. She is increase her UE/LE strength and reports no pain otday. continued working on posterior shoulder and gross LE strength today. She is making good progress with physical therapy and would benefit from continued physicla therapy for 4 more weeks to work on advancing HEP and maximizing her strength by addressing the deficits listed.    PT Frequency 2x / week    PT Duration 4 weeks    PT Treatment/Interventions ADLs/Self Care Home Management;Electrical Stimulation;Iontophoresis 87m/ml Dexamethasone;Moist Heat;Stair training;Gait training;Functional mobility training;Therapeutic activities;Therapeutic exercise;Balance training;Neuromuscular re-education;Manual techniques;Patient/family education;Passive range of motion;Dry needling;Vasopneumatic Device    PT Next Visit Plan update HEP.   gross hip strengthening, gait training/ endurance training. trial posterior shoulder strength, update for balance    PT Home Exercise Plan [id number]- scapular retraction (discontinue per pt), chin tuck, seated ab set, sit to stand, supine clamshell, supine march with band, supine bridge with band, standing hip abd/ extension, standing marching. row with red band, seated trunk rotation, calf stretch standing, corner balance rhomberg/ tandem             Patient will benefit from skilled therapeutic intervention in order to improve the following deficits and impairments:  Improper body mechanics, Increased muscle spasms, Decreased strength, Pain, Postural dysfunction, Decreased activity tolerance, Decreased balance, Decreased endurance, Decreased range of motion  Visit Diagnosis: Other abnormalities of gait and mobility - Plan: PT plan of care cert/re-cert  Abnormal posture - Plan: PT plan of care cert/re-cert  Muscle weakness (generalized) - Plan: PT plan of care cert/re-cert  Cervicalgia - Plan: PT plan of care  cert/re-cert  Pain in thoracic spine - Plan: PT plan of care cert/re-cert     Problem List Patient Active Problem List   Diagnosis Date Noted   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GBonita [place] [address]Phone: [phone]  Fax:  [phone] Name: Gloria RolloMRN: [id number] of Birth: [birth year]

## 2021-05-15 ENCOUNTER — Other Ambulatory Visit: Payer: Self-pay

## 2021-05-15 ENCOUNTER — Encounter: Payer: Self-pay | Admitting: Physical Therapy

## 2021-05-15 ENCOUNTER — Ambulatory Visit: Payer: Medicare HMO | Attending: Neurosurgery | Admitting: Physical Therapy

## 2021-05-15 DIAGNOSIS — M6281 Muscle weakness (generalized): Secondary | ICD-10-CM | POA: Insufficient documentation

## 2021-05-15 DIAGNOSIS — M546 Pain in thoracic spine: Secondary | ICD-10-CM | POA: Insufficient documentation

## 2021-05-15 DIAGNOSIS — M542 Cervicalgia: Secondary | ICD-10-CM | POA: Diagnosis present

## 2021-05-15 DIAGNOSIS — R2689 Other abnormalities of gait and mobility: Secondary | ICD-10-CM | POA: Insufficient documentation

## 2021-05-15 DIAGNOSIS — R293 Abnormal posture: Secondary | ICD-10-CM | POA: Diagnosis present

## 2021-05-15 NOTE — Therapy (Signed)
[place], [place], [address] Phone: [phone]   Fax:  [phone]  Physical Therapy Treatment  Patient Details  Name: Gloria Goodman MRN: [id number] Date of Birth: 20-Oct-1932 Referring Provider (PT): Earnie Larsson MD   Encounter Date: 05/15/2021   PT End of Session - 05/15/21 1409     Visit Number 14    Number of Visits 17    Date for PT Re-Evaluation 06/03/21    Authorization Type [employer]etna [id number] note (done on 9th visit)  Kx Mod at 15th , FOTO 10th visit    Progress Note Due on Visit 19    PT Start Time 1400    PT Stop Time 1440    PT Time Calculation (min) 40 min             Past Medical History:  Diagnosis Date   Cataract, nuclear sclerotic, left eye 09/12/2016   Chest tightness 03/10/2017   Diverticulosis    DJD (degenerative joint disease) of cervical spine    GAD (generalized anxiety disorder) 04/28/2018   Insomnia    Migraine    Mixed hyperlipidemia    Osteopenia    Ovarian cancer ([place]) 1997   PAT (paroxysmal atrial tachycardia) ([place]) 01/26/2017   Right upper quadrant pain    Vitreous detachment of both eyes     Past Surgical History:  Procedure Laterality Date   BREAST CYST ASPIRATION     CATARACT EXTRACTION Bilateral 2022   COLONOSCOPY  2008   ESOPHAGOGASTRODUODENOSCOPY  08/22/2016   EYE SURGERY     LAMINECTOMY N/A 03/08/2021   Procedure: Thoracic two - Thoracic four laminectomy with resection of intradural extramedullary tumor;  Surgeon: Earnie Larsson, MD;  Location: [place];  Service: Neurosurgery;  Laterality: N/A;   TOTAL ABDOMINAL HYSTERECTOMY W/ BILATERAL SALPINGOOPHORECTOMY  1997   UPPER GASTROINTESTINAL ENDOSCOPY  08/22/2016    There were no vitals filed for this visit.   Subjective Assessment - 05/15/21 1408     Subjective My legs feel a little weak this afternoon. It usually only happens in the morning.    Currently in Pain? No/denies                   [facility] Adult PT Treatment/Exercise - 05/15/21 0001       Lumbar Exercises: Aerobic   Nustep L5 x 6 minutes UE/LE      Lumbar Exercises: Seated   Sit to Stand 10 reps   cues for posture- head positon/chin tuck   Sit to Stand Limitations 2 sets 10# KB    Other Seated Lumbar Exercises seated horiz abduction green x 20 ER x 20      Lumbar Exercises: Supine   Clam 20 reps    Clam Limitations green band    Bridge with clamshell 20 reps   green   Straight Leg Raise 10 reps    Straight Leg Raises Limitations 2 sets each      Shoulder Exercises: Standing   Extension 20 reps    Theraband Level (Shoulder Extension) Level 2 (Red)    Row 20 reps    Theraband Level (Shoulder Row) Level 2 (Red)      Manual Therapy   Manual therapy comments MTPR upper traps, suboccipital release                       PT Short Term Goals - 05/06/21 1415       PT SHORT TERM GOAL #  1   Title pt to be IND with initial HEP    Status Achieved      PT SHORT TERM GOAL #2   Title pt to verbalize/ demo efficient posture and lifting mechancis to reduce and prevent back and neck pain    Status Achieved      PT SHORT TERM GOAL #3   Title pt to be able to amb for >/= 20 min with SPC or less with no report of instability or pain    Status Achieved      PT SHORT TERM GOAL #4   Title increase 5 x STS to </= 14 sec to demo improved in mobility    Status Achieved               PT Long Term Goals - 05/15/21 1423       PT LONG TERM GOAL #1   Title increase gross bil LE strength >/= 4+/5 to promote stability with standing/ walking    Time 8    Period Weeks    Status Partially Met      PT LONG TERM GOAL #2   Title increase bil UE strenght to >/= 4+/5 to assiwth with functional lifting and ADLS with on report of pain or limitations    Time 8    Period Weeks    Status Partially Met      PT LONG TERM GOAL #3   Title pt to be able to stand and walk for >/=45 min with LRAD for  functional endurance required for ADLS and short community amb    Baseline walking 20 min in neighborhood without AD    Time 8    Period Weeks    Status Partially Met      PT LONG TERM GOAL #4   Title increase FOTO score to >/=61% limited to demo improvement in function    Baseline 49%    Time 8    Period Weeks    Status On-going      PT LONG TERM GOAL #5   Title pt to be IND with all HEP and is able to maintain and progress current LOF IND    Time 8    Period Weeks    Status On-going                   Plan - 05/15/21 1443     Clinical Impression Statement pt arrives reporting overall improvement with PT.She does note leg fatigue today that is more than usual.  Continued with general strengthening with good tolerance. She reports TPDN relieved her pain for 2 days and she is interested in more treatments. Will plan to discuss extension of POC with Primary PT at next session.    PT Treatment/Interventions ADLs/Self Care Home Management;Electrical Stimulation;Iontophoresis 63m/ml Dexamethasone;Moist Heat;Stair training;Gait training;Functional mobility training;Therapeutic activities;Therapeutic exercise;Balance training;Neuromuscular re-education;Manual techniques;Patient/family education;Passive range of motion;Dry needling;Vasopneumatic Device    PT Next Visit Plan discuss renewal or end of POC after scheduled visits. Pt interested in TPDN, KX modifier    PT Home Exercise Plan [id number]- scapular retraction (discontinue per pt), chin tuck, seated ab set, sit to stand, supine clamshell, supine march with band, supine bridge with band, standing hip abd/ extension, standing marching. row with red band, seated trunk rotation, calf stretch standing, corner balance rhomberg/ tandem    Consulted and Agree with Plan of Care Patient             Patient will benefit from  skilled therapeutic intervention in order to improve the following deficits and impairments:  Improper body  mechanics, Increased muscle spasms, Decreased strength, Pain, Postural dysfunction, Decreased activity tolerance, Decreased balance, Decreased endurance, Decreased range of motion  Visit Diagnosis: Other abnormalities of gait and mobility  Muscle weakness (generalized)  Cervicalgia  Pain in thoracic spine  Abnormal posture     Problem List Patient Active Problem List   Diagnosis Date Noted   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

## 2021-05-21 ENCOUNTER — Other Ambulatory Visit: Payer: Self-pay

## 2021-05-21 ENCOUNTER — Ambulatory Visit: Payer: Medicare HMO | Admitting: Physical Therapy

## 2021-05-21 DIAGNOSIS — R293 Abnormal posture: Secondary | ICD-10-CM

## 2021-05-21 DIAGNOSIS — R2689 Other abnormalities of gait and mobility: Secondary | ICD-10-CM | POA: Diagnosis not present

## 2021-05-21 DIAGNOSIS — M542 Cervicalgia: Secondary | ICD-10-CM

## 2021-05-21 DIAGNOSIS — M546 Pain in thoracic spine: Secondary | ICD-10-CM

## 2021-05-21 DIAGNOSIS — M6281 Muscle weakness (generalized): Secondary | ICD-10-CM

## 2021-05-21 NOTE — Therapy (Signed)
[place], [place], [address] Phone: [phone]   Fax:  [phone]  Physical Therapy Treatment  Patient Details  Name: Gloria Goodman MRN: [id number] Date of Birth: 06/16/1933 Referring Provider (PT): Earnie Larsson MD   Encounter Date: 05/21/2021   PT End of Session - 05/21/21 1421     Visit Number 15    Number of Visits 17    Date for PT Re-Evaluation 06/03/21    Authorization Type Aetna [id number] note (done on 9th visit)  Kx Mod at 15th , FOTO 10th visit    PT Start Time 1418    PT Stop Time 1500    PT Time Calculation (min) 42 min    Activity Tolerance Patient tolerated treatment well    Behavior During Therapy [facility] for tasks assessed/performed             Past Medical History:  Diagnosis Date   Cataract, nuclear sclerotic, left eye 09/12/2016   Chest tightness 03/10/2017   Diverticulosis    DJD (degenerative joint disease) of cervical spine    GAD (generalized anxiety disorder) 04/28/2018   Insomnia    Migraine    Mixed hyperlipidemia    Osteopenia    Ovarian cancer ([place]) 1997   PAT (paroxysmal atrial tachycardia) ([place]) 01/26/2017   Right upper quadrant pain    Vitreous detachment of both eyes     Past Surgical History:  Procedure Laterality Date   BREAST CYST ASPIRATION     CATARACT EXTRACTION Bilateral 2022   COLONOSCOPY  2008   ESOPHAGOGASTRODUODENOSCOPY  08/22/2016   EYE SURGERY     LAMINECTOMY N/A 03/08/2021   Procedure: Thoracic two - Thoracic four laminectomy with resection of intradural extramedullary tumor;  Surgeon: Earnie Larsson, MD;  Location: [place];  Service: Neurosurgery;  Laterality: N/A;   TOTAL ABDOMINAL HYSTERECTOMY W/ BILATERAL SALPINGOOPHORECTOMY  1997   UPPER GASTROINTESTINAL ENDOSCOPY  08/22/2016    There were no vitals filed for this visit.   Subjective Assessment - 05/21/21 1422     Subjective "I had one of the best days the other day, but last night  my leg was cramping at night."    Patient Stated Goals know what to do to help herself, return to daily activities as normal as possible.    Currently in Pain? No/denies    Pain Score 0-No pain    Pain Orientation Right;Left    Multiple Pain Sites Yes    Pain Score 2   at worst 6-7/10   Pain Orientation Left    Pain Descriptors / Indicators Aching    Pain Type Chronic pain    Pain Onset More than a month ago    Pain Frequency Intermittent    Aggravating Factors  unsure    Pain Relieving Factors N/A                    [facility] Adult PT Treatment/Exercise:  Therapeutic Exercise: Chin tuck head lift 1 x 10 holding 10 sec Upper trap stretch 1 x 30 bil Levator scapulae stretch 1 x 30 bil Nu-step L6 x 7 min UE/LE - increased time to promote endurance.  Performed while seated on dyna-disc Seated marching 2 x 20 cues to keep core tight Scapular retraction with double ER 2 x 15 with GTB Rows 2 x 15 with GTB Horizontal abduction 2 x 15 with GTB PNF D2 2  x 12 bil with RTB Sit to stand from lowered tabled 1  x 10 with 3 second hold before sitting down   Manual Therapy: Skilled palpation and monitoring of pt during TPDN Sub-occipital release  IASTM along bil upper trap  Neuromuscular re-ed: N/A  Therapeutic Activity: N/A  Modalities: N/A  Self Care: N/A  Consider / progression for next session:             PT Education - 05/21/21 1504     Education Details Reviewed and updated HEP today and discussed POC.    Person(s) Educated Patient    Methods Explanation;Verbal cues;Handout    Comprehension Verbalized understanding;Verbal cues required              PT Short Term Goals - 05/06/21 1415       PT SHORT TERM GOAL #1   Title pt to be IND with initial HEP    Status Achieved      PT SHORT TERM GOAL #2   Title pt to verbalize/ demo efficient posture and lifting mechancis to reduce and prevent back and neck pain    Status Achieved      PT SHORT  TERM GOAL #3   Title pt to be able to amb for >/= 20 min with SPC or less with no report of instability or pain    Status Achieved      PT SHORT TERM GOAL #4   Title increase 5 x STS to </= 14 sec to demo improved in mobility    Status Achieved               PT Long Term Goals - 05/15/21 1423       PT LONG TERM GOAL #1   Title increase gross bil LE strength >/= 4+/5 to promote stability with standing/ walking    Time 8    Period Weeks    Status Partially Met      PT LONG TERM GOAL #2   Title increase bil UE strenght to >/= 4+/5 to assiwth with functional lifting and ADLS with on report of pain or limitations    Time 8    Period Weeks    Status Partially Met      PT LONG TERM GOAL #3   Title pt to be able to stand and walk for >/=45 min with LRAD for functional endurance required for ADLS and short community amb    Baseline walking 20 min in neighborhood without AD    Time 8    Period Weeks    Status Partially Met      PT LONG TERM GOAL #4   Title increase FOTO score to >/=61% limited to demo improvement in function    Baseline 49%    Time 8    Period Weeks    Status On-going      PT LONG TERM GOAL #5   Title pt to be IND with all HEP and is able to maintain and progress current LOF IND    Time 8    Period Weeks    Status On-going                   Plan - 05/21/21 1439     Clinical Impression Statement Gloria Goodman is making good progress with physical therapy regarding her neck/ shouldres and reports if she wasn't trying to assess/ describe to the therapist she probably wouldn't notice it. Continued TPDN focusing on bil upper trap followed with IASTM techinques. continued working on posterior chain strengthening with focus on increased  reps/ sets to promote endurnace/ stability. She is doing well plan to see her for the last 2 scheduled appointments and anticpate discharge at that time.    PT Treatment/Interventions ADLs/Self Care Home  Management;Electrical Stimulation;Iontophoresis 44m/ml Dexamethasone;Moist Heat;Stair training;Gait training;Functional mobility training;Therapeutic activities;Therapeutic exercise;Balance training;Neuromuscular re-education;Manual techniques;Patient/family education;Passive range of motion;Dry needling;Vasopneumatic Device    PT Next Visit Plan only 2 more visits then plan to D/C, strength and endurance training. KX mod    PT Home Exercise Plan [id number]- scapular retraction (discontinue per pt), chin tuck, seated ab set, sit to stand, supine clamshell, supine march with band, supine bridge with band, standing hip abd/ extension, standing marching. row with red band, seated trunk rotation, calf stretch standing, corner balance rhomberg/ tandem    Consulted and Agree with Plan of Care Patient             Patient will benefit from skilled therapeutic intervention in order to improve the following deficits and impairments:  Improper body mechanics, Increased muscle spasms, Decreased strength, Pain, Postural dysfunction, Decreased activity tolerance, Decreased balance, Decreased endurance, Decreased range of motion  Visit Diagnosis: Other abnormalities of gait and mobility  Muscle weakness (generalized)  Cervicalgia  Pain in thoracic spine  Abnormal posture     Problem List Patient Active Problem List   Diagnosis Date Noted   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

## 2021-05-28 ENCOUNTER — Encounter: Payer: Self-pay | Admitting: Physical Therapy

## 2021-05-28 ENCOUNTER — Other Ambulatory Visit: Payer: Self-pay

## 2021-05-28 ENCOUNTER — Ambulatory Visit: Payer: Medicare HMO | Admitting: Physical Therapy

## 2021-05-28 DIAGNOSIS — R293 Abnormal posture: Secondary | ICD-10-CM

## 2021-05-28 DIAGNOSIS — R2689 Other abnormalities of gait and mobility: Secondary | ICD-10-CM | POA: Diagnosis not present

## 2021-05-28 DIAGNOSIS — M546 Pain in thoracic spine: Secondary | ICD-10-CM

## 2021-05-28 DIAGNOSIS — M6281 Muscle weakness (generalized): Secondary | ICD-10-CM

## 2021-05-28 DIAGNOSIS — M542 Cervicalgia: Secondary | ICD-10-CM

## 2021-05-28 NOTE — Therapy (Signed)
[place], [place], [address] Phone: [phone]   Fax:  [phone]  Physical Therapy Treatment  Patient Details  Name: Gloria Goodman MRN: [id number] Date of Birth: 18-Apr-1933 Referring Provider (PT): Earnie Larsson MD   Encounter Date: 05/28/2021   PT End of Session - 05/28/21 1339     Visit Number 16    Number of Visits 17    Date for PT Re-Evaluation 06/03/21    Authorization Type Aetna [id number] note (done on 9th visit)  Kx Mod at 15th , FOTO 10th visit    Progress Note Due on Visit 42    PT Start Time 1315    PT Stop Time 1355    PT Time Calculation (min) 40 min             Past Medical History:  Diagnosis Date   Cataract, nuclear sclerotic, left eye 09/12/2016   Chest tightness 03/10/2017   Diverticulosis    DJD (degenerative joint disease) of cervical spine    GAD (generalized anxiety disorder) 04/28/2018   Insomnia    Migraine    Mixed hyperlipidemia    Osteopenia    Ovarian cancer ([place]) 1997   PAT (paroxysmal atrial tachycardia) ([place]) 01/26/2017   Right upper quadrant pain    Vitreous detachment of both eyes     Past Surgical History:  Procedure Laterality Date   BREAST CYST ASPIRATION     CATARACT EXTRACTION Bilateral 2022   COLONOSCOPY  2008   ESOPHAGOGASTRODUODENOSCOPY  08/22/2016   EYE SURGERY     LAMINECTOMY N/A 03/08/2021   Procedure: Thoracic two - Thoracic four laminectomy with resection of intradural extramedullary tumor;  Surgeon: Earnie Larsson, MD;  Location: [place];  Service: Neurosurgery;  Laterality: N/A;   TOTAL ABDOMINAL HYSTERECTOMY W/ BILATERAL SALPINGOOPHORECTOMY  1997   UPPER GASTROINTESTINAL ENDOSCOPY  08/22/2016    There were no vitals filed for this visit.   Subjective Assessment - 05/28/21 1318     Subjective My left hip and leg were bothering me yesterday and a couple days last week    Currently in Pain? No/denies               [facility]  Adult PT Treatment/Exercise:   Therapeutic Exercise: Chin tuck head lift 1 x 10 holding 10 sec Upper trap stretch 1 x 30 bil Levator scapulae stretch 1 x 30 bil Nu-step L6 x 7 min UE/LE - increased time to promote endurance.  Performed while seated on dyna-disc Seated marching 2 x 20 cues to keep core tight Scapular retraction with double ER 2 x 15 with GTB Rows 2 x 15 with GTB Horizontal abduction 2 x 15 with GTB PNF D2 2  x 12 bil with RTB Sit to stand from lowered tabled 1 x 10 with 3 second hold before sitting down  Supine march and bridge with band on thighs Tandem stance with head turns  Calf stretch 2 x 30 sec       PT Short Term Goals - 05/06/21 1415       PT SHORT TERM GOAL #1   Title pt to be IND with initial HEP    Status Achieved      PT SHORT TERM GOAL #2   Title pt to verbalize/ demo efficient posture and lifting mechancis to reduce and prevent back and neck pain    Status Achieved      PT SHORT TERM GOAL #3   Title pt to be  able to amb for >/= 20 min with SPC or less with no report of instability or pain    Status Achieved      PT SHORT TERM GOAL #4   Title increase 5 x STS to </= 14 sec to demo improved in mobility    Status Achieved               PT Long Term Goals - 05/15/21 1423       PT LONG TERM GOAL #1   Title increase gross bil LE strength >/= 4+/5 to promote stability with standing/ walking    Time 8    Period Weeks    Status Partially Met      PT LONG TERM GOAL #2   Title increase bil UE strenght to >/= 4+/5 to assiwth with functional lifting and ADLS with on report of pain or limitations    Time 8    Period Weeks    Status Partially Met      PT LONG TERM GOAL #3   Title pt to be able to stand and walk for >/=45 min with LRAD for functional endurance required for ADLS and short community amb    Baseline walking 20 min in neighborhood without AD    Time 8    Period Weeks    Status Partially Met      PT LONG TERM GOAL #4    Title increase FOTO score to >/=61% limited to demo improvement in function    Baseline 49%    Time 8    Period Weeks    Status On-going      PT LONG TERM GOAL #5   Title pt to be IND with all HEP and is able to maintain and progress current LOF IND    Time 8    Period Weeks    Status On-going                   Plan - 05/28/21 1343     Clinical Impression Statement Pt reports some left hip and upper leg pain 3 times since last week. No pain on arrival.    PT Treatment/Interventions ADLs/Self Care Home Management;Electrical Stimulation;Iontophoresis 48m/ml Dexamethasone;Moist Heat;Stair training;Gait training;Functional mobility training;Therapeutic activities;Therapeutic exercise;Balance training;Neuromuscular re-education;Manual techniques;Patient/family education;Passive range of motion;Dry needling;Vasopneumatic Device    PT Next Visit Plan only 1 more visits then plan to D/C, strength and endurance training. KX mod    PT Home Exercise Plan [id number]- scapular retraction (discontinue per pt), chin tuck, seated ab set, sit to stand, supine clamshell, supine march with band, supine bridge with band, standing hip abd/ extension, standing marching. row with red band, seated trunk rotation, calf stretch standing, corner balance rhomberg/ tandem    Consulted and Agree with Plan of Care Patient             Patient will benefit from skilled therapeutic intervention in order to improve the following deficits and impairments:  Improper body mechanics, Increased muscle spasms, Decreased strength, Pain, Postural dysfunction, Decreased activity tolerance, Decreased balance, Decreased endurance, Decreased range of motion  Visit Diagnosis: Other abnormalities of gait and mobility  Muscle weakness (generalized)  Cervicalgia  Pain in thoracic spine  Abnormal posture     Problem List Patient Active Problem List   Diagnosis Date Noted   Intradural-extramedullary spinal cord  neoplasms 03/08/2021    DDorene Ar PTA 05/28/2021, 1:55 PM  [place]  [address] Phone: [phone]   Fax:  [phone]  Name: Gloria Goodman MRN: [id number] Date of Birth: 10-Aug-1933

## 2021-06-12 ENCOUNTER — Other Ambulatory Visit: Payer: Self-pay

## 2021-06-12 ENCOUNTER — Ambulatory Visit: Payer: Medicare HMO | Attending: Neurosurgery | Admitting: Physical Therapy

## 2021-06-12 DIAGNOSIS — R2689 Other abnormalities of gait and mobility: Secondary | ICD-10-CM | POA: Insufficient documentation

## 2021-06-12 DIAGNOSIS — M6281 Muscle weakness (generalized): Secondary | ICD-10-CM | POA: Diagnosis present

## 2021-06-12 DIAGNOSIS — M546 Pain in thoracic spine: Secondary | ICD-10-CM | POA: Insufficient documentation

## 2021-06-12 DIAGNOSIS — M542 Cervicalgia: Secondary | ICD-10-CM | POA: Diagnosis present

## 2021-06-12 DIAGNOSIS — R293 Abnormal posture: Secondary | ICD-10-CM | POA: Diagnosis present

## 2021-06-12 NOTE — Therapy (Signed)
[place], [place], [address] Phone: [phone]   Fax:  [phone]  Physical Therapy Treatment / discharge note  Patient Details  Name: Gloria Goodman MRN: [id number] Date of Birth: 1933/08/05 Referring Provider (PT): Earnie Larsson MD   Encounter Date: 06/12/2021   PT End of Session - 06/12/21 1417     Visit Number 17    Number of Visits 17    Date for PT Re-Evaluation 06/12/21    Authorization Type Aetna [id number] note (done on 9th visit)  Kx Mod at 15th , FOTO 10th visit    Progress Note Due on Visit 19    PT Start Time 1418    PT Stop Time 1448    PT Time Calculation (min) 30 min    Activity Tolerance Patient tolerated treatment well    Behavior During Therapy [facility] for tasks assessed/performed             Past Medical History:  Diagnosis Date   Cataract, nuclear sclerotic, left eye 09/12/2016   Chest tightness 03/10/2017   Diverticulosis    DJD (degenerative joint disease) of cervical spine    GAD (generalized anxiety disorder) 04/28/2018   Insomnia    Migraine    Mixed hyperlipidemia    Osteopenia    Ovarian cancer ([place]) 1997   PAT (paroxysmal atrial tachycardia) ([place]) 01/26/2017   Right upper quadrant pain    Vitreous detachment of both eyes     Past Surgical History:  Procedure Laterality Date   BREAST CYST ASPIRATION     CATARACT EXTRACTION Bilateral 2022   COLONOSCOPY  2008   ESOPHAGOGASTRODUODENOSCOPY  08/22/2016   EYE SURGERY     LAMINECTOMY N/A 03/08/2021   Procedure: Thoracic two - Thoracic four laminectomy with resection of intradural extramedullary tumor;  Surgeon: Earnie Larsson, MD;  Location: [place];  Service: Neurosurgery;  Laterality: N/A;   TOTAL ABDOMINAL HYSTERECTOMY W/ BILATERAL SALPINGOOPHORECTOMY  1997   UPPER GASTROINTESTINAL ENDOSCOPY  08/22/2016    There were no vitals filed for this visit.   Subjective Assessment - 06/12/21 1420     Subjective " I had  some pain in the R shoulder and my left leg which gave me some issues."    Currently in Pain? Yes    Pain Score 1     Pain Location Thoracic    Pain Orientation Right;Left    Pain Descriptors / Indicators Tender    Pain Score 0   burning pain inthe R shoulder at night   Pain Orientation Right    Pain Type Chronic pain    Pain Onset More than a month ago    Pain Frequency Intermittent    Aggravating Factors  unsure    Pain Relieving Factors N/A                [facility] PT Assessment - 06/12/21 0001       Assessment   Medical Diagnosis Intradural-extramedullary spinal cord neoplasms    Referring Provider (PT) Earnie Larsson MD      Observation/Other Assessments   Focus on Therapeutic Outcomes (FOTO)  61% function      Strength   Right Shoulder Flexion 4+/5    Right Shoulder Extension 5/5    Right Shoulder ABduction 4/5    Right Shoulder Internal Rotation 4+/5    Right Shoulder External Rotation 4+/5    Left Shoulder Flexion 4+/5    Left Shoulder Extension 5/5    Left Shoulder ABduction  4+/5    Left Shoulder Internal Rotation 4+/5    Left Shoulder External Rotation 4+/5    Right Hip Flexion 4+/5    Right Hip Extension 4/5    Right Hip ABduction 4+/5    Right Hip ADduction 4+/5    Left Hip Flexion 4+/5    Left Hip Extension 4/5    Left Hip ABduction 4+/5    Left Hip ADduction 4+/5    Right Knee Flexion 5/5    Right Knee Extension 5/5    Left Knee Flexion 5/5    Left Knee Extension 5/5                        [facility] Adult PT Treatment/Exercise:  Therapeutic Exercise: Sit to stand 2 x 10 cues tofor controlled descent.  Chin tuck 1 x 5 holding 5  Standing hip abduction / extension 1 x 12 with RTB   Manual Therapy:  N/A  Neuromuscular re-ed: N/A  Therapeutic Activity: N/A  Modalities: N/A  Self Care: Reviewed posture in sleeping to reduce stress on the neck and back. Using bolster under knees and trailing out different pillows to reduce  potential referred pain from the neck and back.   Consider / progression for next session:             PT Education - 06/12/21 1426     Education Details Reviewed HEP and discussed progression of strengthening to promote endurance with increased reps, sets, and resistance. reviewed posture, see self care section    Person(s) Educated Patient    Methods Explanation;Verbal cues;Handout    Comprehension Verbalized understanding;Verbal cues required              PT Short Term Goals - 05/06/21 1415       PT SHORT TERM GOAL #1   Title pt to be IND with initial HEP    Status Achieved      PT SHORT TERM GOAL #2   Title pt to verbalize/ demo efficient posture and lifting mechancis to reduce and prevent back and neck pain    Status Achieved      PT SHORT TERM GOAL #3   Title pt to be able to amb for >/= 20 min with SPC or less with no report of instability or pain    Status Achieved      PT SHORT TERM GOAL #4   Title increase 5 x STS to </= 14 sec to demo improved in mobility    Status Achieved               PT Long Term Goals - 06/12/21 1423       PT LONG TERM GOAL #1   Title increase gross bil LE strength >/= 4+/5 to promote stability with standing/ walking    Baseline met goal for all muscles except for glutes max    Period Weeks    Status Partially Met      PT LONG TERM GOAL #2   Title increase bil UE strenght to >/= 4+/5 to assiwth with functional lifting and ADLS with on report of pain or limitations    Period Weeks    Status Achieved      PT LONG TERM GOAL #3   Title pt to be able to stand and walk for >/=45 min with LRAD for functional endurance required for ADLS and short community amb    Status Achieved      PT LONG  TERM GOAL #4   Title increase FOTO score to >/=61% limited to demo improvement in function    Period Weeks    Status Achieved      PT LONG TERM GOAL #5   Title pt to be IND with all HEP and is able to maintain and progress  current LOF IND    Period Weeks    Status Achieved                   Plan - 06/12/21 1452     Clinical Impression Statement Gloria Goodman has made excellent progress with physical therapy increased gross UE/LE strength and additionally reports minimal soreness in the upper thoracic region. She does report some intermittent RUE/LLE pain that occurs mostly with laying down but isn't constant. Reviewed her HEP and discussed progression of strengthening. She is able to maintain and progress her current LOF Ind and will be dishcarged from PT today.             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

## 2021-06-12 NOTE — Patient Instructions (Signed)
Access Code: [email or web address] URL: [email or web address] Date: 06/12/2021 Prepared by: Starr Lake  Exercises Seated Transversus Abdominis Bracing - 1 x daily - 7 x weekly - 2 sets - 10 reps - 5 seconds hold Seated Scapular Retraction - 1 x daily - 7 x weekly - 2 sets - 10 reps - 5 seconds hold Standing Cervical Retraction - 1 x daily - 7 x weekly - 2 sets - 10 reps - 5 second hold Sit to Stand - 2 x daily - 7 x weekly - 2 sets - 10 reps Hooklying Clamshell with Resistance - 1 x daily - 7 x weekly - 2 sets - 10 reps Seated Gentle Upper Trapezius Stretch - 1 x daily - 7 x weekly - 1 sets - 3 reps - 20-30 hold Gentle Levator Scapulae Stretch - 1 x daily - 7 x weekly - 1 sets - 3 reps - 20-30 hold Supine March with Resistance Band - 1 x daily - 7 x weekly - 3 sets - 10 reps Bridge with Resistance - 1 x daily - 7 x weekly - 2 sets - 10 reps Standing Hip Abduction with Counter Support - 1 x daily - 7 x weekly - 3 sets - 12 reps Standing Hip Extension with Counter Support - 1 x daily - 7 x weekly - 3 sets - 12 reps Standing March with Counter Support - 1 x daily - 7 x weekly - 3 sets - 12 reps Seated Thoracic Rotation - 1 x daily - 7 x weekly - 2 sets - 10 reps - 2 hold Standing Shoulder Row with Anchored Resistance - 1 x daily - 7 x weekly - 2 sets - 10 reps Gastroc Stretch on Wall - 1 x daily - 7 x weekly - 2 sets - 2 reps - 30 hold Seated Shoulder Horizontal Abduction with Resistance - 1 x daily - 7 x weekly - 2 sets - 10 reps Seated Single Shoulder PNF D2 with Resistance - 1 x daily - 7 x weekly - 2 sets - 12 reps

## 2021-10-01 ENCOUNTER — Other Ambulatory Visit: Payer: Self-pay | Admitting: Neurosurgery

## 2021-10-01 DIAGNOSIS — M48062 Spinal stenosis, lumbar region with neurogenic claudication: Secondary | ICD-10-CM

## 2021-10-23 ENCOUNTER — Ambulatory Visit
Admission: RE | Admit: 2021-10-23 | Discharge: 2021-10-23 | Disposition: A | Discharge status: Home / Self Care | Payer: Medicare HMO | Source: Ambulatory Visit | Attending: Neurosurgery | Admitting: Neurosurgery

## 2021-10-23 ENCOUNTER — Other Ambulatory Visit: Payer: Self-pay

## 2021-10-23 DIAGNOSIS — M48062 Spinal stenosis, lumbar region with neurogenic claudication: Secondary | ICD-10-CM

## 2021-10-31 ENCOUNTER — Other Ambulatory Visit: Payer: Self-pay | Admitting: Nurse Practitioner

## 2021-10-31 DIAGNOSIS — Z1231 Encounter for screening mammogram for malignant neoplasm of breast: Secondary | ICD-10-CM

## 2021-11-06 ENCOUNTER — Ambulatory Visit
Admission: RE | Admit: 2021-11-06 | Discharge: 2021-11-06 | Disposition: A | Discharge status: Home / Self Care | Payer: Medicare HMO | Source: Ambulatory Visit | Attending: Nurse Practitioner | Admitting: Nurse Practitioner

## 2021-11-06 DIAGNOSIS — Z1231 Encounter for screening mammogram for malignant neoplasm of breast: Secondary | ICD-10-CM

## 2023-03-14 IMAGING — MG MM DIGITAL SCREENING BILAT W/ TOMO AND CAD
6 of 10 series · 6 of 30 positions shown · non-contrast
Comparison: Previous exam(s).

CLINICAL DATA: Screening.

EXAM:
DIGITAL SCREENING BILATERAL MAMMOGRAM WITH TOMOSYNTHESIS AND CAD
TECHNIQUE: Bilateral screening digital craniocaudal and mediolateral oblique
mammograms were obtained. Bilateral screening digital breast
tomosynthesis was performed. The images were evaluated with
computer-aided detection.

[R CC synth-2D (1 of 2)]
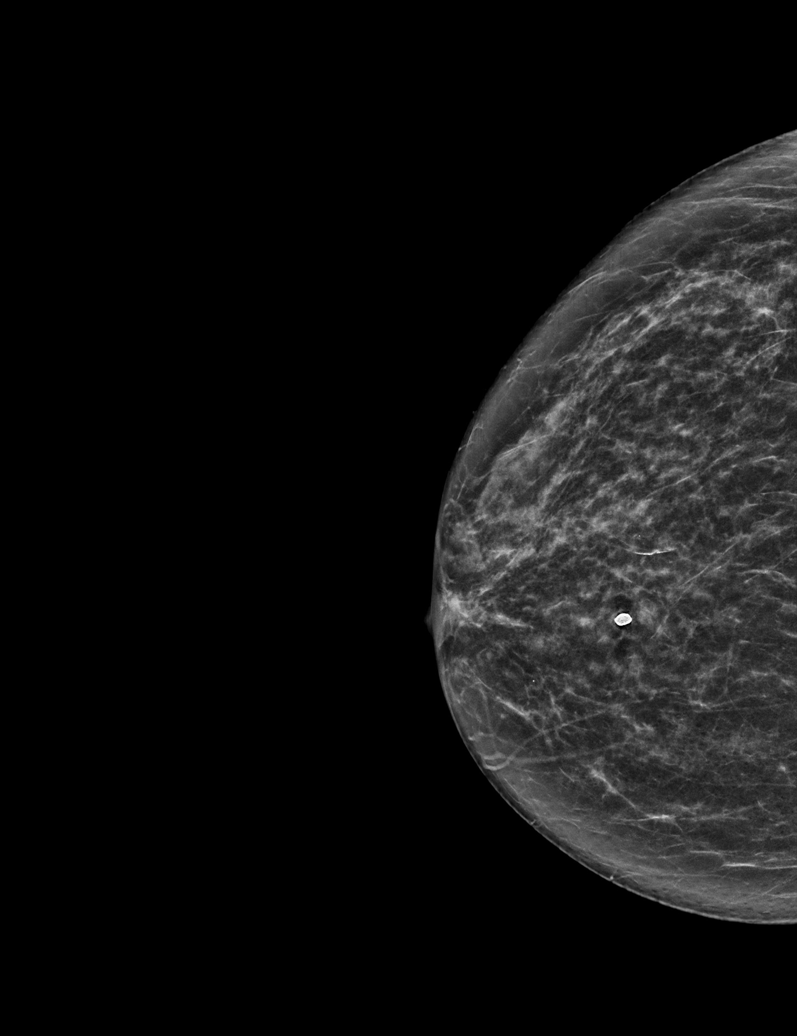

[R MLO synth-2D]
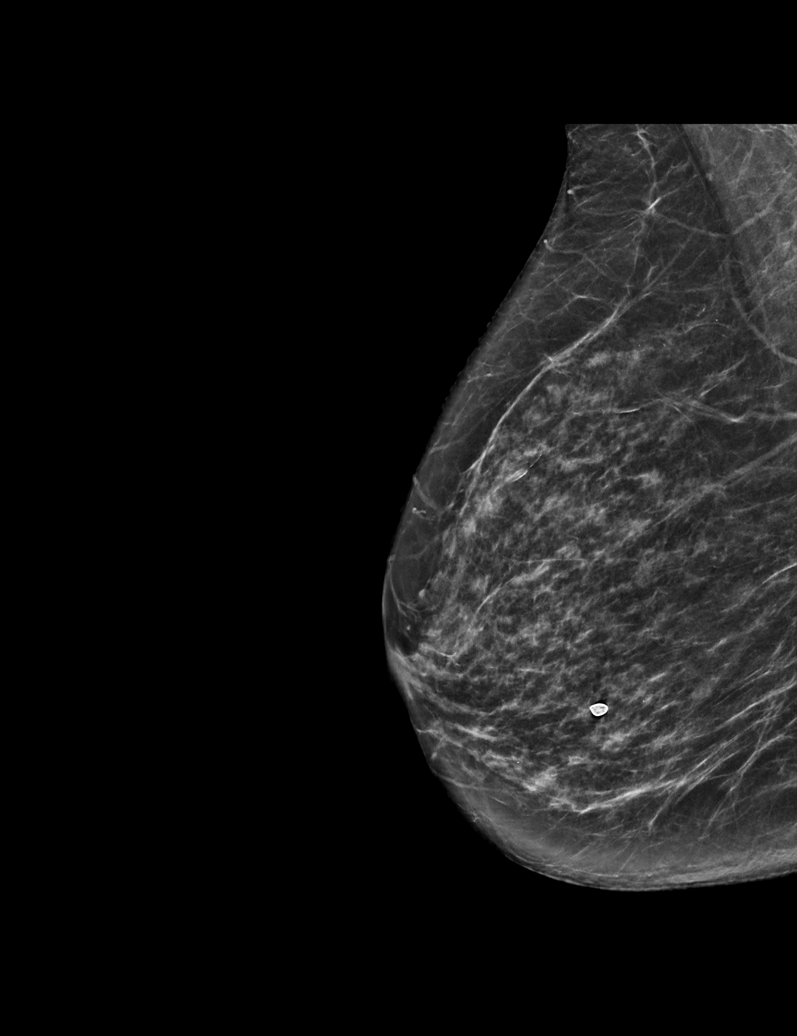

[R CC synth-2D (2 of 2)]
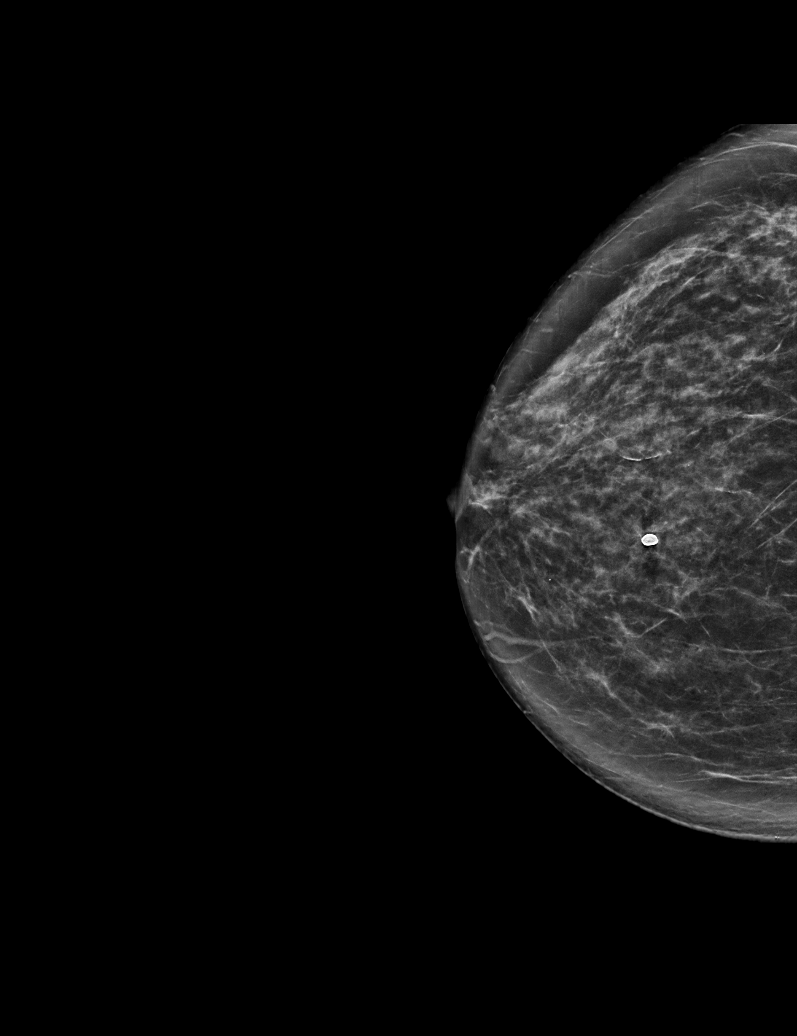

[L CC synth-2D]
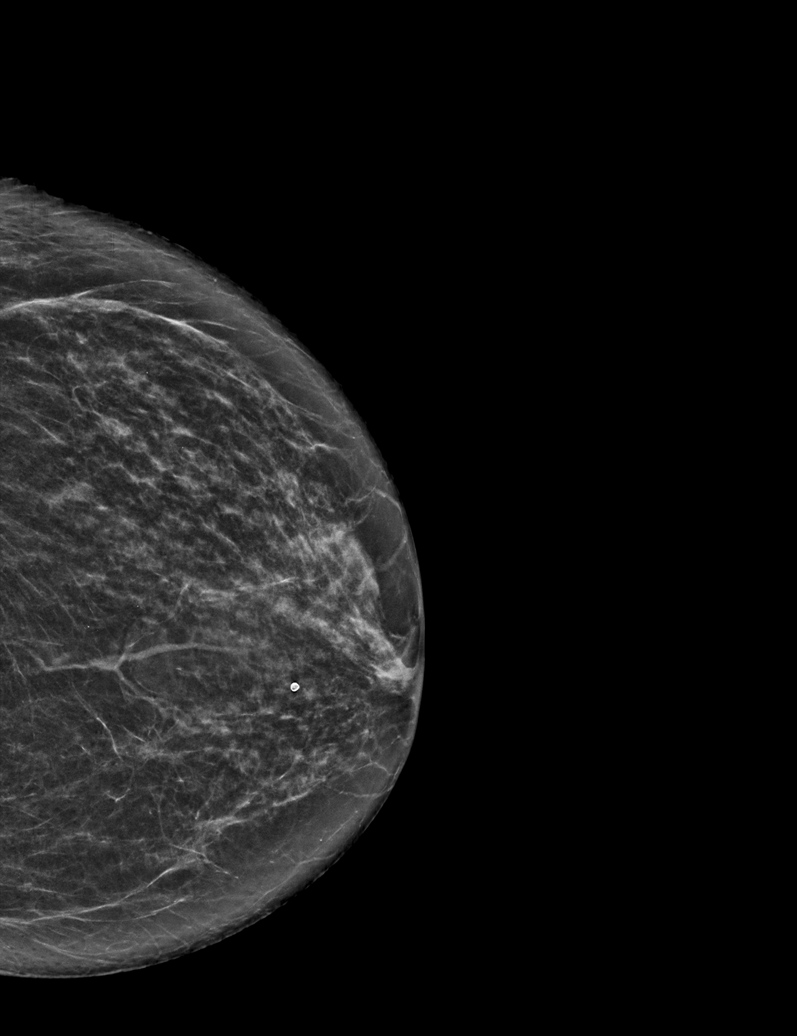

[L MLO synth-2D]
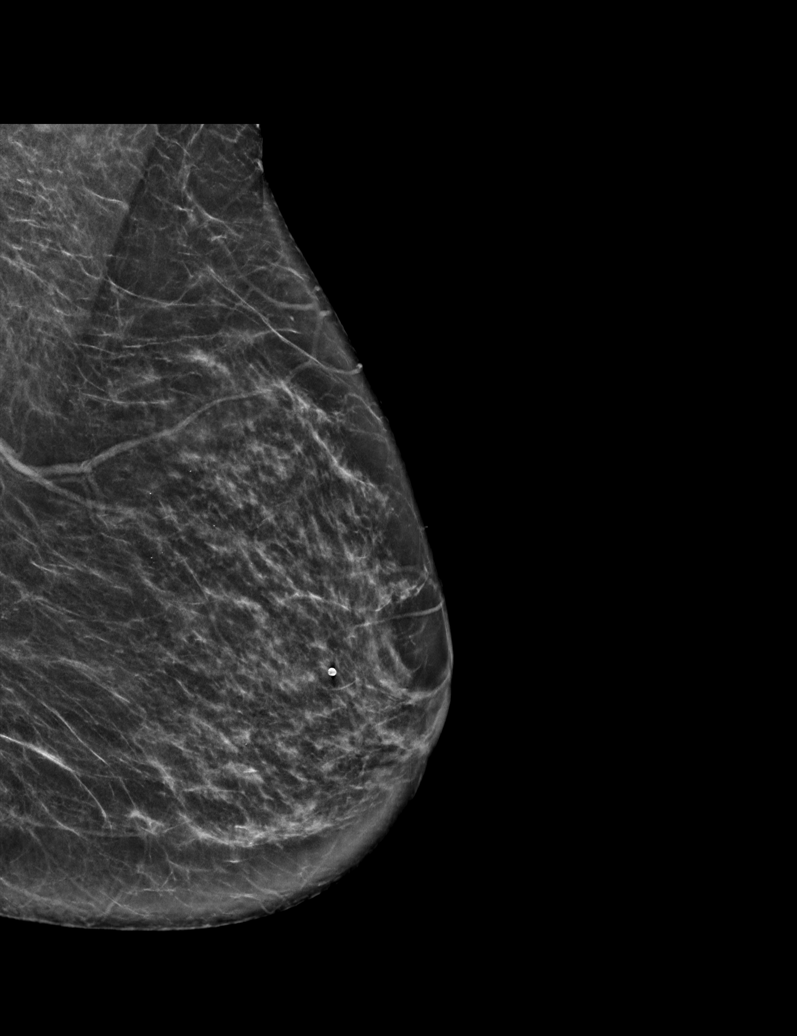

[L MLO tomo · tomo slice 31/61.0]
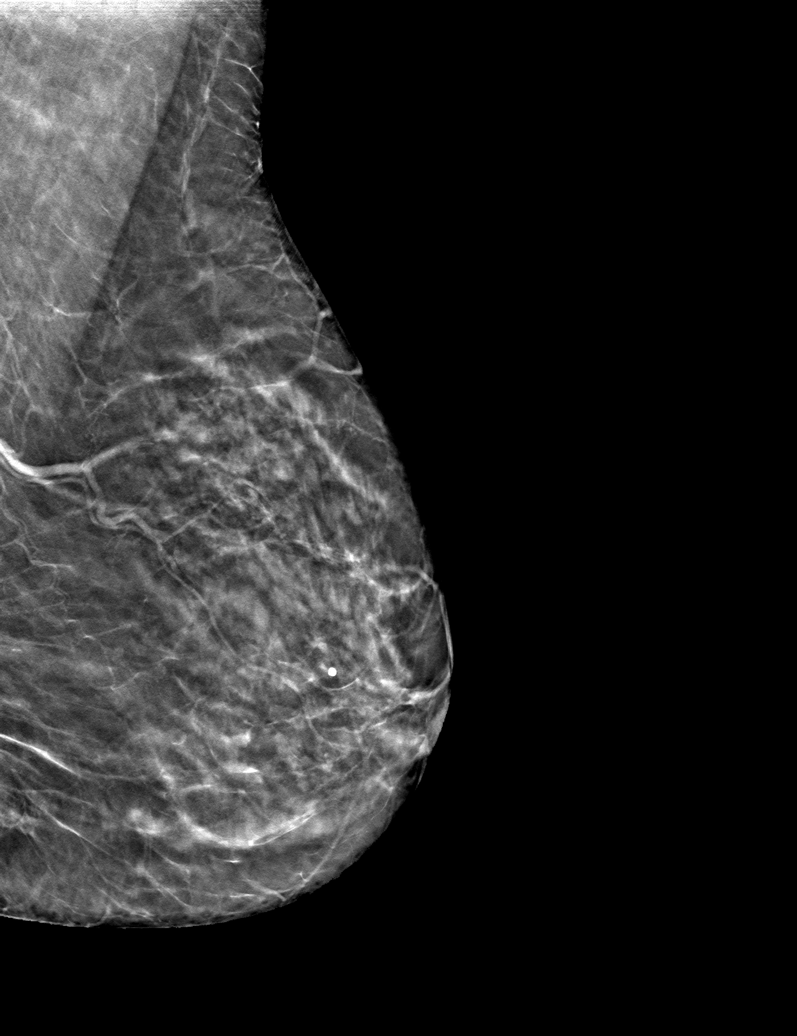

[6 of 30 positions shown; findings below may reference images not displayed]

ACR Breast Density Category c: The breast tissue is heterogeneously
dense, which may obscure small masses.
FINDINGS: There are no findings suspicious for malignancy.
IMPRESSION: No mammographic evidence of malignancy. A result letter of this
screening mammogram will be mailed directly to the patient.

RECOMMENDATION:
Screening mammogram in one year. (Code:Q3-W-BC3)

BI-RADS CATEGORY  1: Negative.

## 2023-10-29 ENCOUNTER — Other Ambulatory Visit: Payer: Self-pay | Admitting: Neurosurgery

## 2023-10-29 DIAGNOSIS — M48062 Spinal stenosis, lumbar region with neurogenic claudication: Secondary | ICD-10-CM

## 2023-10-29 DIAGNOSIS — D497 Neoplasm of unspecified behavior of endocrine glands and other parts of nervous system: Secondary | ICD-10-CM

## 2023-11-30 ENCOUNTER — Other Ambulatory Visit

## 2023-12-01 ENCOUNTER — Encounter: Payer: Self-pay | Admitting: Neurosurgery

## 2023-12-03 ENCOUNTER — Other Ambulatory Visit

## 2023-12-03 ENCOUNTER — Ambulatory Visit
Admission: RE | Admit: 2023-12-03 | Discharge: 2023-12-03 | Disposition: A | Discharge status: Home / Self Care | Source: Ambulatory Visit | Attending: Neurosurgery | Admitting: Neurosurgery

## 2023-12-03 DIAGNOSIS — D497 Neoplasm of unspecified behavior of endocrine glands and other parts of nervous system: Secondary | ICD-10-CM

## 2023-12-03 DIAGNOSIS — M48062 Spinal stenosis, lumbar region with neurogenic claudication: Secondary | ICD-10-CM

## 2023-12-03 MED ORDER — GADOPICLENOL 0.5 MMOL/ML IV SOLN
5.0000 mL | Freq: Once | INTRAVENOUS | Status: AC | PRN
  Administered 2023-12-03: 5 mL via INTRAVENOUS

## 2023-12-19 ENCOUNTER — Other Ambulatory Visit

## 2024-03-24 ENCOUNTER — Ambulatory Visit (HOSPITAL_COMMUNITY)
Admission: EM | Admit: 2024-03-24 | Discharge: 2024-03-24 | Disposition: A | Discharge status: Home / Self Care | Attending: Family Medicine | Admitting: Family Medicine

## 2024-03-24 ENCOUNTER — Encounter (HOSPITAL_COMMUNITY): Payer: Self-pay

## 2024-03-24 DIAGNOSIS — R6889 Other general symptoms and signs: Secondary | ICD-10-CM | POA: Insufficient documentation

## 2024-03-24 DIAGNOSIS — R6883 Chills (without fever): Secondary | ICD-10-CM | POA: Diagnosis present

## 2024-03-24 DIAGNOSIS — R4189 Other symptoms and signs involving cognitive functions and awareness: Secondary | ICD-10-CM | POA: Diagnosis present

## 2024-03-24 LAB — CBC WITH DIFFERENTIAL/PLATELET
Abs Immature Granulocytes: 0.02 K/uL (ref 0.00–0.07)
Basophils Absolute: 0 K/uL (ref 0.0–0.1)
Basophils Relative: 1 %
Eosinophils Absolute: 0.1 K/uL (ref 0.0–0.5)
Eosinophils Relative: 1 %
HCT: 40 % (ref 36.0–46.0)
Hemoglobin: 13.4 g/dL (ref 12.0–15.0)
Immature Granulocytes: 0 %
Lymphocytes Relative: 14 %
Lymphs Abs: 0.8 K/uL (ref 0.7–4.0)
MCH: 28.9 pg (ref 26.0–34.0)
MCHC: 33.5 g/dL (ref 30.0–36.0)
MCV: 86.2 fL (ref 80.0–100.0)
Monocytes Absolute: 0.6 K/uL (ref 0.1–1.0)
Monocytes Relative: 11 %
Neutro Abs: 4.1 K/uL (ref 1.7–7.7)
Neutrophils Relative %: 73 %
Platelets: 176 K/uL (ref 150–400)
RBC: 4.64 MIL/uL (ref 3.87–5.11)
RDW: 14 % (ref 11.5–15.5)
WBC: 5.6 K/uL (ref 4.0–10.5)
nRBC: 0 % (ref 0.0–0.2)

## 2024-03-24 LAB — COMPREHENSIVE METABOLIC PANEL WITH GFR
ALT: 16 U/L (ref 0–44)
AST: 21 U/L (ref 15–41)
Albumin: 3.6 g/dL (ref 3.5–5.0)
Alkaline Phosphatase: 52 U/L (ref 38–126)
Anion gap: 11 (ref 5–15)
BUN: 7 mg/dL — ABNORMAL LOW (ref 8–23)
CO2: 26 mmol/L (ref 22–32)
Calcium: 9 mg/dL (ref 8.9–10.3)
Chloride: 98 mmol/L (ref 98–111)
Creatinine, Ser: 0.73 mg/dL (ref 0.44–1.00)
GFR, Estimated: >60 mL/min (ref >60)
Glucose, Bld: 102 mg/dL — ABNORMAL HIGH (ref 70–99)
Potassium: 4 mmol/L (ref 3.5–5.1)
Sodium: 135 mmol/L (ref 135–145)
Total Bilirubin: 0.8 mg/dL (ref 0.0–1.2)
Total Protein: 6.5 g/dL (ref 6.5–8.1)

## 2024-03-24 LAB — POCT URINALYSIS DIP (MANUAL ENTRY)
Bilirubin, UA: NEGATIVE
Glucose, UA: NEGATIVE mg/dL
Leukocytes, UA: NEGATIVE
Nitrite, UA: NEGATIVE
Protein Ur, POC: NEGATIVE mg/dL
Spec Grav, UA: 1.01 (ref 1.010–1.025)
Urobilinogen, UA: 0.2 U/dL
pH, UA: 6.5 (ref 5.0–8.0)

## 2024-03-24 LAB — POCT FASTING CBG KUC MANUAL ENTRY: POCT Glucose (KUC): 108 mg/dL — AB (ref 70–99)

## 2024-03-24 LAB — TSH: TSH: 0.662 u[IU]/mL (ref 0.350–4.500)

## 2024-03-24 LAB — POC COVID19/FLU A&B COMBO
Covid Antigen, POC: NEGATIVE
Influenza A Antigen, POC: NEGATIVE
Influenza B Antigen, POC: NEGATIVE

## 2024-03-24 MED ORDER — ONDANSETRON 4 MG PO TBDP: 4.0000 mg | ORAL_TABLET | Freq: Three times a day (TID) | ORAL | 0 refills | Status: AC | PRN

## 2024-03-24 NOTE — ED Triage Notes (Signed)
 Chief Complaint: chills, denies fever, shaking, low appetite, headache, dizziness, dryness in the eyes/mouth/etc.   Sick exposure: No  Onset: last night   Prescriptions or OTC medications tried: No    New foods, medications, or products: Yes- had recent medication changes. Tapering off xanax  and started on Lexapro   Recent Travel: No

## 2024-03-24 NOTE — Discharge Instructions (Signed)
 You have had labs (blood tests) sent today. We will call you with any significant abnormalities or if there is need to begin or change treatment or pursue further follow up.  You may also review your test results online through MyChart. If you do not have a MyChart account, instructions to sign up should be on your discharge paperwork.

## 2024-03-25 ENCOUNTER — Ambulatory Visit: Payer: Self-pay

## 2024-03-29 ENCOUNTER — Ambulatory Visit (HOSPITAL_COMMUNITY)
Admission: EM | Admit: 2024-03-29 | Discharge: 2024-03-29 | Disposition: A | Discharge status: Home / Self Care | Attending: Nurse Practitioner | Admitting: Nurse Practitioner

## 2024-03-29 DIAGNOSIS — F321 Major depressive disorder, single episode, moderate: Secondary | ICD-10-CM | POA: Diagnosis not present

## 2024-03-29 DIAGNOSIS — Z79899 Other long term (current) drug therapy: Secondary | ICD-10-CM | POA: Diagnosis not present

## 2024-03-29 DIAGNOSIS — F419 Anxiety disorder, unspecified: Secondary | ICD-10-CM | POA: Insufficient documentation

## 2024-03-29 NOTE — Discharge Instructions (Addendum)
 Xanax  taper is appropriate-please stick with the taper and call the [facility] on [address] to make an appt to see Dr. Curry for outpatient follow up.  Get help right away if: You have thoughts about hurting yourself or others. Get help right away if you feel like you may hurt yourself or others, or have thoughts about taking your own life. Go to your nearest emergency room or: Call 911. Call the National Suicide Prevention Lifeline at (351)207-7076 or 988 in the [place] This is open 24 hours a day. If you're a Veteran: Call 988 and press 1. This is open 24 hours a day. Text the [employer] at 479-863-4569. Summary Mental health is not just the absence of mental illness. It involves understanding your emotions and behaviors, and taking steps to manage them in a healthy way. If you have symptoms of mental or emotional distress, get help from family, friends, a health care provider, or a mental health professional. Practice good mental health behaviors such as stress management skills, self-calming skills, exercise, healthy sleeping and eating, and supportive relationships. This information is not intended to replace advice given to you by your health care provider. Make sure you discuss any questions you have with your health care provider.  Education provided on the fact that if experiencing worsening of psychiatry symptoms including suicidal ideations, homicidal ideations, or having auditory/visual hallucinations, etc, to call 911, 988, come back to this location, or go to the nearest ER. Pt verbalized understanding.

## 2024-03-29 NOTE — ED Provider Notes (Signed)
 [facility]   [id number] 03/24/24 Arrival Time: 1305  ASSESSMENT & PLAN:  1. Chills   2. Brain fog   3. Not feeling great    Unclear etiology. Possibly related to d/c Xanax  and starting Lexapro. No significant lab abnormalities that would explain symptoms.  Mentions occas nausea. To use if needed: Meds ordered this encounter  Medications   ondansetron  (ZOFRAN -ODT) 4 MG disintegrating tablet    Sig: Take 1 tablet (4 mg total) by mouth every 8 (eight) hours as needed for nausea or vomiting.    Dispense:  30 tablet    Refill:  0   Results for orders placed or performed during the hospital encounter of 03/24/24  POC Covid19/Flu A&B Antigen   Collection Time: 03/24/24  2:41 PM  Result Value Ref Range   Influenza A Antigen, POC Negative Negative   Influenza B Antigen, POC Negative Negative   Covid Antigen, POC Negative Negative  CBC with Differential/Platelet   Collection Time: 03/24/24  3:35 PM  Result Value Ref Range   WBC 5.6 4.0 - 10.5 K/uL   RBC 4.64 3.87 - 5.11 MIL/uL   Hemoglobin 13.4 12.0 - 15.0 g/dL   HCT 59.9 63.9 - 53.9 %   MCV 86.2 80.0 - 100.0 fL   MCH 28.9 26.0 - 34.0 pg   MCHC 33.5 30.0 - 36.0 g/dL   RDW 85.9 88.4 - 84.4 %   Platelets 176 150 - 400 K/uL   nRBC 0.0 0.0 - 0.2 %   Neutrophils Relative % 73 %   Neutro Abs 4.1 1.7 - 7.7 K/uL   Lymphocytes Relative 14 %   Lymphs Abs 0.8 0.7 - 4.0 K/uL   Monocytes Relative 11 %   Monocytes Absolute 0.6 0.1 - 1.0 K/uL   Eosinophils Relative 1 %   Eosinophils Absolute 0.1 0.0 - 0.5 K/uL   Basophils Relative 1 %   Basophils Absolute 0.0 0.0 - 0.1 K/uL   Immature Granulocytes 0 %   Abs Immature Granulocytes 0.02 0.00 - 0.07 K/uL  Comprehensive metabolic panel with GFR   Collection Time: 03/24/24  3:35 PM  Result Value Ref Range   Sodium 135 135 - 145 mmol/L   Potassium 4.0 3.5 - 5.1 mmol/L   Chloride 98 98 - 111 mmol/L   CO2 26 22 - 32 mmol/L   Glucose, Bld 102 (H) 70 - 99 mg/dL   BUN 7 (L) 8 -  23 mg/dL   Creatinine, Ser 9.26 0.44 - 1.00 mg/dL   Calcium  9.0 8.9 - 10.3 mg/dL   Total Protein 6.5 6.5 - 8.1 g/dL   Albumin 3.6 3.5 - 5.0 g/dL   AST 21 15 - 41 U/L   ALT 16 0 - 44 U/L   Alkaline Phosphatase 52 38 - 126 U/L   Total Bilirubin 0.8 0.0 - 1.2 mg/dL   GFR, Estimated >39 >39 mL/min   Anion gap 11 5 - 15  TSH   Collection Time: 03/24/24  3:35 PM  Result Value Ref Range   TSH 0.662 0.350 - 4.500 uIU/mL  POC CBG monitoring   Collection Time: 03/24/24  3:53 PM  Result Value Ref Range   POCT Glucose (KUC) 108 (A) 70 - 99 mg/dL  POC urinalysis dipstick   Collection Time: 03/24/24  3:59 PM  Result Value Ref Range   Color, UA yellow yellow   Clarity, UA clear clear   Glucose, UA negative negative mg/dL   Bilirubin, UA negative negative   Ketones,  POC UA trace (5) (A) negative mg/dL   Spec Grav, UA 8.989 8.989 - 1.025   Blood, UA trace-lysed (A) negative   pH, UA 6.5 5.0 - 8.0   Protein Ur, POC negative negative mg/dL   Urobilinogen, UA 0.2 0.2 or 1.0 E.U./dL   Nitrite, UA Negative Negative   Leukocytes, UA Negative Negative    Reviewed expectations re: course of current medical issues. Questions answered. Outlined signs and symptoms indicating need for more acute intervention. Patient verbalized understanding. After Visit Summary given.   SUBJECTIVE:  History from: patient and daughter; mostly from daughter. Gloria Goodman is a 89 y.o. female who presents with her daughter. Daughter reports she complained of chills yesterday evening; not feeling well; feel like I have brain fog. Has been tapering off Xanax  for the past couple of weeks; after decades of use; ques relation. Has been placed on Lexapro and ques if this could be making her feel this way. Denies urinary symptoms. Decreased appetite.    OBJECTIVE:  Vitals:   03/24/24 1400  BP: 124/69  Pulse: 74  Resp: 16  Temp: (!) 97.5 F (36.4 C)  TempSrc: Oral  SpO2: 97%  Weight: 49.9 kg  Height: 5'  1 (1.549 m)    General appearance: alert, oriented, no acute distress Eyes: PERRLA; EOMI; conjunctivae normal HENT: normocephalic; atraumatic Neck: supple with FROM Lungs: without labored respirations; speaks full sentences without difficulty; CTAB Heart: regular rate and rhythm without murmer Chest Wall: without tenderness to palpation Abdomen: soft, non-tender; no guarding or rebound tenderness Extremities: without edema; without calf swelling or tenderness; symmetrical without gross deformities Skin: warm and dry; without rash or lesions Neuro: normal gait Psychological: alert and cooperative; normal mood and affect  Labs: Results for orders placed or performed during the hospital encounter of 03/24/24  POC Covid19/Flu A&B Antigen   Collection Time: 03/24/24  2:41 PM  Result Value Ref Range   Influenza A Antigen, POC Negative Negative   Influenza B Antigen, POC Negative Negative   Covid Antigen, POC Negative Negative  CBC with Differential/Platelet   Collection Time: 03/24/24  3:35 PM  Result Value Ref Range   WBC 5.6 4.0 - 10.5 K/uL   RBC 4.64 3.87 - 5.11 MIL/uL   Hemoglobin 13.4 12.0 - 15.0 g/dL   HCT 59.9 63.9 - 53.9 %   MCV 86.2 80.0 - 100.0 fL   MCH 28.9 26.0 - 34.0 pg   MCHC 33.5 30.0 - 36.0 g/dL   RDW 85.9 88.4 - 84.4 %   Platelets 176 150 - 400 K/uL   nRBC 0.0 0.0 - 0.2 %   Neutrophils Relative % 73 %   Neutro Abs 4.1 1.7 - 7.7 K/uL   Lymphocytes Relative 14 %   Lymphs Abs 0.8 0.7 - 4.0 K/uL   Monocytes Relative 11 %   Monocytes Absolute 0.6 0.1 - 1.0 K/uL   Eosinophils Relative 1 %   Eosinophils Absolute 0.1 0.0 - 0.5 K/uL   Basophils Relative 1 %   Basophils Absolute 0.0 0.0 - 0.1 K/uL   Immature Granulocytes 0 %   Abs Immature Granulocytes 0.02 0.00 - 0.07 K/uL  Comprehensive metabolic panel with GFR   Collection Time: 03/24/24  3:35 PM  Result Value Ref Range   Sodium 135 135 - 145 mmol/L   Potassium 4.0 3.5 - 5.1 mmol/L   Chloride 98 98 - 111  mmol/L   CO2 26 22 - 32 mmol/L   Glucose, Bld 102 (H) 70 -  99 mg/dL   BUN 7 (L) 8 - 23 mg/dL   Creatinine, Ser 9.26 0.44 - 1.00 mg/dL   Calcium  9.0 8.9 - 10.3 mg/dL   Total Protein 6.5 6.5 - 8.1 g/dL   Albumin 3.6 3.5 - 5.0 g/dL   AST 21 15 - 41 U/L   ALT 16 0 - 44 U/L   Alkaline Phosphatase 52 38 - 126 U/L   Total Bilirubin 0.8 0.0 - 1.2 mg/dL   GFR, Estimated >39 >39 mL/min   Anion gap 11 5 - 15  TSH   Collection Time: 03/24/24  3:35 PM  Result Value Ref Range   TSH 0.662 0.350 - 4.500 uIU/mL  POC CBG monitoring   Collection Time: 03/24/24  3:53 PM  Result Value Ref Range   POCT Glucose (KUC) 108 (A) 70 - 99 mg/dL  POC urinalysis dipstick   Collection Time: 03/24/24  3:59 PM  Result Value Ref Range   Color, UA yellow yellow   Clarity, UA clear clear   Glucose, UA negative negative mg/dL   Bilirubin, UA negative negative   Ketones, POC UA trace (5) (A) negative mg/dL   Spec Grav, UA 8.989 8.989 - 1.025   Blood, UA trace-lysed (A) negative   pH, UA 6.5 5.0 - 8.0   Protein Ur, POC negative negative mg/dL   Urobilinogen, UA 0.2 0.2 or 1.0 E.U./dL   Nitrite, UA Negative Negative   Leukocytes, UA Negative Negative   Labs Reviewed  COMPREHENSIVE METABOLIC PANEL WITH GFR - Abnormal; Notable for the following components:      Result Value   Glucose, Bld 102 (*)    BUN 7 (*)    All other components within normal limits  POCT URINALYSIS DIP (MANUAL ENTRY) - Abnormal; Notable for the following components:   Ketones, POC UA trace (5) (*)    Blood, UA trace-lysed (*)    All other components within normal limits  POCT FASTING CBG KUC MANUAL ENTRY - Abnormal; Notable for the following components:   POCT Glucose (KUC) 108 (*)    All other components within normal limits  CBC WITH DIFFERENTIAL/PLATELET  TSH  POC COVID19/FLU A&B COMBO    Imaging: No results found.   Allergies  Allergen Reactions   Gabapentin Other (See Comments)   Statins     Pt felt bad all over.    Oxybutynin Anxiety    Too drying    Past Medical History:  Diagnosis Date   Cataract, nuclear sclerotic, left eye 09/12/2016   Chest tightness 03/10/2017   Diverticulosis    DJD (degenerative joint disease) of cervical spine    GAD (generalized anxiety disorder) 04/28/2018   Insomnia    Migraine    Mixed hyperlipidemia    Osteopenia    Ovarian cancer (HCC) 1997   PAT (paroxysmal atrial tachycardia) (HCC) 01/26/2017   Right upper quadrant pain    Vitreous detachment of both eyes    Social History   Socioeconomic History   Marital status: Married    Spouse name: John   Number of children: 2   Years of education: Not on file   Highest education level: Not on file  Occupational History   Not on file  Tobacco Use   Smoking status: Former    Types: Cigarettes   Smokeless tobacco: Never  Vaping Use   Vaping status: Never Used  Substance and Sexual Activity   Alcohol  use: Not Currently   Drug use: Never   Sexual activity: Not  Currently  Other Topics Concern   Not on file  Social History Narrative   Not on file   Social Drivers of Health   Financial Resource Strain: Low Risk  (12/18/2023)   Received from [facility]   Overall Financial Resource Strain (CARDIA)    Difficulty of Paying Living Expenses: Not hard at all  Food Insecurity: No Food Insecurity (12/18/2023)   Received [facility]   Hunger Vital Sign    Within the past 12 months, you worried that your food would run out before you got the money to buy more.: Never true    Within the past 12 months, the food you bought just didn't last and you didn't have money to get more.: Never true  Transportation Needs: No Transportation Needs (12/18/2023)   Received from [facility] - Transportation    Lack of Transportation (Medical): No    Lack of Transportation (Non-Medical): No  Physical Activity: Inactive (12/18/2023)   Received from [facility]   Exercise Vital Sign    On average, how many days  per week do you engage in moderate to strenuous exercise (like a brisk walk)?: 0 days    On average, how many minutes do you engage in exercise at this level?: 10 min  Stress: Stress Concern Present (12/18/2023)   Received from [facility] - Occupational Stress Questionnaire    Feeling of Stress : Rather much  Social Connections: Socially Integrated (12/18/2023)   Received from [facility]   Social Network    How would you rate your social network (family, work, friends)?: Good participation with social networks  Recent Concern: Social Connections - Somewhat Isolated (11/22/2023)   Received from [facility]   Social Network    How would you rate your social network (family, work, friends)?: Restricted participation with some degree of social isolation  Intimate Partner Violence: Not At Risk (11/22/2023)   Received from [facility]   HITS    Over the last 12 months how often did your partner physically hurt you?: Never    Over the last 12 months how often did your partner insult you or talk down to you?: Never    Over the last 12 months how often did your partner threaten you with physical harm?: Never    Over the last 12 months how often did your partner scream or curse at you?: Never   Family History  Problem Relation Age of Onset   Breast cancer Maternal Aunt    Past Surgical History:  Procedure Laterality Date   BREAST CYST ASPIRATION     CATARACT EXTRACTION Bilateral 2022   COLONOSCOPY  2008   ESOPHAGOGASTRODUODENOSCOPY  08/22/2016   EYE SURGERY     LAMINECTOMY N/A 03/08/2021   Procedure: Thoracic two - Thoracic four 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

## 2024-03-29 NOTE — Progress Notes (Signed)
   03/29/24 1733  [facility] Triage Screening (Walk-ins at [facility] only)  How Did You Hear About Us ? Primary Care  What Is the Reason for Your Visit/Call Today? PT Gaetana Pouch 89Y female presents to [facility] accompanied by her daughter, voluntarily. PT states she is diagnosed with GAD and panic attacks and was put on Xanax  several years ago. PT's PCP suggested that the pt come here to get a provider to review her taper schedule. PT states she's been having tremors, loss of appetite and unable to think clearly; pt thinks these are withdrawal symptoms. PT denies SI, HI, AVH and alcohol  and substance use.  How Long Has This Been Causing You Problems? 1 wk - 1 month  Have You Recently Had Any Thoughts About Hurting Yourself? No  Are You Planning to Commit Suicide/Harm Yourself At This time? No  Have you Recently Had Thoughts About Hurting Someone Sherral? No  Are You Planning To Harm Someone At This Time? No  Physical Abuse Denies  Verbal Abuse Denies  Sexual Abuse Denies  Exploitation of patient/patient's resources Denies  Self-Neglect Denies  Are you currently experiencing any auditory, visual or other hallucinations? No  Have You Used Any Alcohol  or Drugs in the Past 24 Hours? No  Do you have any current medical co-morbidities that require immediate attention? No  Clinician description of patient physical appearance/behavior: neat, calm, cooperative, seems oriented  What Do You Feel Would Help You the Most Today? Medication(s)  Determination of Need Routine (7 days)  Options For Referral Medication Management

## 2024-03-29 NOTE — ED Provider Notes (Signed)
 Behavioral Health Urgent Care Medical Screening Exam  Patient Name: Gloria Goodman MRN: [id number] Date of Evaluation: 03/29/24 Chief Complaint:  Medication management Diagnosis:  Final diagnoses:  Medication management  Current moderate episode of major depressive disorder without prior episode (HCC)   History of Present illness: Gloria Goodman is a 89 y.o. female who presents to this location today accompanied by her daughter, seeking a second opinion on her Xanax  taper.  Assessment: Patient is seen along with her daughter, which have consent, and daughter Annett), provides some of the information with daughter's consent.  As per patient and her daughter, she has been on Xanax  for over 30 years, and recently, due to not feeling too good, she thought that the Xanax  was causing her symptoms.  She therefore asked her primary care provider to taper her off the Xanax , and PCP started to taper, and included Lexapro at 5 mg daily to patient's medication regimen.  Both states that the taper has been ongoing now for the past 3 weeks, and patient is continuing to report withdrawal symptoms such as generalized malaise, tremors, unsteadiness, and feeling lousy.  Patient states that the current dose of her Xanax  is too low, and that she would like an increased dose.  Daughter states that she took the patient to the [facility], maybe last Thursday, I had a urinalysis done, as well as blood work done, which showed mild dehydration, but otherwise unremarkable.  Both report that they are here at the urging of the PCP, to determine if current dose of Xanax  is appropriate, and if it needs to be adjusted.  Writer examined the dose which patient is currently on, and she is currently on 0.25 mg which she is taking 3 times a day, she is noted to be ambulatory with her walker, mild tremors noted to bilateral hands.  Speech is clear and coherent, daughter reports that withdrawal symptoms have significantly improved  since the taper started.  Education was provided to patient and daughter that current taper is appropriate, and for patient to continue to taper as ordered by her primary care provider, and follow up with an outpatient psychiatrist for further management.  Patient and daughter also educated for patient to continue current dose of Lexapro, increasing to 5 mg, change time to nightly to help with nighttime anxiety and sleep, since both reports that her anxiety typically happen at nighttime.  Both report that medication was initially ordered for 5 mg, but the patient decided to start taking 2.5 mg, and told that the Lexapro was causing her symptoms.  Benefits, rationales, and possible side effects of Lexapro as well as Xanax  educated to patient and daughter, who both verbalized understanding.  Outpatient resources for psychiatry medication management provided to patient and daughter states that she will call tomorrow to schedule an appointment with an outpatient psychiatrist for further medication management.  Patient denies suicidal ideations, denies homicidal ideations, denies auditory or visual disturbances, denies paranoia, there are no overt signs of psychosis.  Patient left urgent care in no acute distress.  Education provided on the need to return to this location, should she start experiencing oral psychiatry symptoms.  Patient talks about her husband being at an assisted living facility, states that it is bothersome to her, talks about him having 2 strokes over the past year, active listening and empathy is provided.  Patient states that there is an appointment in place for her to start therapy in the upcoming week.  Flowsheet Row ED from 03/29/2024  in [facility] UC from 03/24/2024 in [facility] at [facility] Admission (Discharged) from 03/08/2021 in [facility]  C-SSRS RISK CATEGORY No Risk No Risk No Risk    Psychiatric Specialty  Exam  Presentation  General Appearance:Casual  Eye Contact:Fair  Speech:Clear and Coherent  Speech Volume:Normal  Handedness:Right   Mood and Affect  Mood:Anxious; Depressed  Affect:Congruent   Thought Process  Thought Processes:Coherent  Descriptions of Associations:No data recorded Orientation:No data recorded Thought Content:Logical; WDL    Hallucinations:None  Ideas of Reference:None  Suicidal Thoughts:No  Homicidal Thoughts:No   Sensorium  Memory:Immediate Fair  Judgment:Fair  Insight:Good   Executive Functions  Concentration:Good  Attention Span:Good  Recall:Fair  Fund of Knowledge:Fair  Language:Fair   Psychomotor Activity  Psychomotor Activity:Normal   Assets  Assets:Resilience; Social Support   Sleep  Sleep:Fair  Number of hours: No data recorded  Physical Exam: Physical Exam Vitals and nursing note reviewed.  Neurological:     Mental Status: She is oriented to person, place, and time.  Psychiatric:        Behavior: Behavior normal.        Thought Content: Thought content normal.    Review of Systems  Psychiatric/Behavioral:  Positive for depression. Negative for hallucinations, memory loss, substance abuse and suicidal ideas. The patient is nervous/anxious and has insomnia.    Blood pressure 126/67, pulse 65, temperature 97.7 F (36.5 C), temperature source Temporal, resp. rate 16, SpO2 98%. There is no height or weight on file to calculate BMI.  Musculoskeletal: Strength & Muscle Tone: decreased-Ambulatory with a walker Gait & Station: unsteady Patient leans: N/A   [facility] MSE Discharge Disposition for Follow up and Recommendations: Based on my evaluation the patient does not appear to have an emergency medical condition and can be discharged with resources and follow up care in outpatient services for Medication Management and Individual Therapy  Verbalized understanding the need to follow up outpatient for  medication management and therapy.  Donia Snell, NP 03/29/2024, 7:24 PM
